# Patient Record
Sex: Female | Born: 1945 | Race: Black or African American | Hispanic: No | Marital: Married | Smoking: Never smoker
Health system: Southern US, Community
[De-identification: ages and names within clinical notes are randomized; demographics above are authoritative.]

## PROBLEM LIST (undated history)

## (undated) DIAGNOSIS — I10 Essential (primary) hypertension: Secondary | ICD-10-CM

## (undated) DIAGNOSIS — D860 Sarcoidosis of lung: Secondary | ICD-10-CM

## (undated) HISTORY — PX: BREAST SURGERY: SHX581

## (undated) HISTORY — PX: FOOT SURGERY: SHX648

---

## 1999-11-04 ENCOUNTER — Other Ambulatory Visit: Admission: RE | Admit: 1999-11-04 | Discharge: 1999-11-04 | Payer: Self-pay | Admitting: Internal Medicine

## 2000-02-02 ENCOUNTER — Encounter: Payer: Self-pay | Admitting: Internal Medicine

## 2000-02-02 ENCOUNTER — Encounter: Admission: RE | Admit: 2000-02-02 | Discharge: 2000-02-02 | Payer: Self-pay | Admitting: Internal Medicine

## 2000-11-21 ENCOUNTER — Other Ambulatory Visit: Admission: RE | Admit: 2000-11-21 | Discharge: 2000-11-21 | Payer: Self-pay | Admitting: Internal Medicine

## 2001-02-05 ENCOUNTER — Encounter: Admission: RE | Admit: 2001-02-05 | Discharge: 2001-02-05 | Payer: Self-pay | Admitting: Internal Medicine

## 2001-02-05 ENCOUNTER — Encounter: Payer: Self-pay | Admitting: Internal Medicine

## 2001-11-11 ENCOUNTER — Ambulatory Visit (HOSPITAL_COMMUNITY): Admission: RE | Admit: 2001-11-11 | Discharge: 2001-11-11 | Payer: Self-pay | Admitting: Gastroenterology

## 2002-02-11 ENCOUNTER — Encounter: Payer: Self-pay | Admitting: Internal Medicine

## 2002-02-11 ENCOUNTER — Encounter: Admission: RE | Admit: 2002-02-11 | Discharge: 2002-02-11 | Payer: Self-pay | Admitting: Internal Medicine

## 2002-04-14 ENCOUNTER — Encounter: Payer: Self-pay | Admitting: *Deleted

## 2002-04-14 ENCOUNTER — Encounter: Admission: RE | Admit: 2002-04-14 | Discharge: 2002-04-14 | Payer: Self-pay | Admitting: *Deleted

## 2003-02-27 ENCOUNTER — Encounter: Payer: Self-pay | Admitting: Internal Medicine

## 2003-02-27 ENCOUNTER — Encounter: Admission: RE | Admit: 2003-02-27 | Discharge: 2003-02-27 | Payer: Self-pay | Admitting: Internal Medicine

## 2003-04-29 ENCOUNTER — Encounter: Admission: RE | Admit: 2003-04-29 | Discharge: 2003-04-29 | Payer: Self-pay | Admitting: Internal Medicine

## 2004-03-03 ENCOUNTER — Encounter: Admission: RE | Admit: 2004-03-03 | Discharge: 2004-03-03 | Payer: Self-pay | Admitting: Internal Medicine

## 2004-03-03 ENCOUNTER — Other Ambulatory Visit: Admission: RE | Admit: 2004-03-03 | Discharge: 2004-03-03 | Payer: Self-pay | Admitting: Internal Medicine

## 2004-09-02 ENCOUNTER — Encounter: Admission: RE | Admit: 2004-09-02 | Discharge: 2004-09-02 | Payer: Self-pay | Admitting: Internal Medicine

## 2005-05-12 ENCOUNTER — Encounter: Admission: RE | Admit: 2005-05-12 | Discharge: 2005-05-12 | Payer: Self-pay | Admitting: Internal Medicine

## 2006-03-02 ENCOUNTER — Encounter: Admission: RE | Admit: 2006-03-02 | Discharge: 2006-03-02 | Payer: Self-pay | Admitting: Internal Medicine

## 2006-05-14 ENCOUNTER — Encounter: Admission: RE | Admit: 2006-05-14 | Discharge: 2006-05-14 | Payer: Self-pay | Admitting: Internal Medicine

## 2007-03-08 ENCOUNTER — Encounter: Admission: RE | Admit: 2007-03-08 | Discharge: 2007-03-08 | Payer: Self-pay | Admitting: Internal Medicine

## 2007-03-08 ENCOUNTER — Other Ambulatory Visit: Admission: RE | Admit: 2007-03-08 | Discharge: 2007-03-08 | Payer: Self-pay | Admitting: Internal Medicine

## 2007-05-21 ENCOUNTER — Encounter: Admission: RE | Admit: 2007-05-21 | Discharge: 2007-05-21 | Payer: Self-pay | Admitting: Internal Medicine

## 2008-03-20 ENCOUNTER — Encounter: Admission: RE | Admit: 2008-03-20 | Discharge: 2008-03-20 | Payer: Self-pay | Admitting: Internal Medicine

## 2008-05-22 ENCOUNTER — Encounter: Admission: RE | Admit: 2008-05-22 | Discharge: 2008-05-22 | Payer: Self-pay | Admitting: Internal Medicine

## 2008-11-11 ENCOUNTER — Ambulatory Visit: Payer: Self-pay | Admitting: Surgery

## 2008-11-11 ENCOUNTER — Encounter (INDEPENDENT_AMBULATORY_CARE_PROVIDER_SITE_OTHER): Payer: Self-pay | Admitting: Emergency Medicine

## 2008-11-11 ENCOUNTER — Emergency Department (HOSPITAL_COMMUNITY): Admission: EM | Admit: 2008-11-11 | Discharge: 2008-11-11 | Payer: Self-pay | Admitting: Emergency Medicine

## 2009-04-14 ENCOUNTER — Encounter: Admission: RE | Admit: 2009-04-14 | Discharge: 2009-04-14 | Payer: Self-pay | Admitting: Internal Medicine

## 2009-05-24 ENCOUNTER — Encounter: Admission: RE | Admit: 2009-05-24 | Discharge: 2009-05-24 | Payer: Self-pay | Admitting: Internal Medicine

## 2010-03-11 ENCOUNTER — Observation Stay (HOSPITAL_COMMUNITY): Admission: EM | Admit: 2010-03-11 | Discharge: 2010-03-11 | Payer: Self-pay | Admitting: Emergency Medicine

## 2010-03-15 ENCOUNTER — Other Ambulatory Visit: Admission: RE | Admit: 2010-03-15 | Discharge: 2010-03-15 | Payer: Self-pay | Admitting: Internal Medicine

## 2010-04-25 ENCOUNTER — Encounter
Admission: RE | Admit: 2010-04-25 | Discharge: 2010-04-25 | Payer: Self-pay | Source: Home / Self Care | Attending: Internal Medicine | Admitting: Internal Medicine

## 2010-05-30 ENCOUNTER — Encounter
Admission: RE | Admit: 2010-05-30 | Discharge: 2010-05-30 | Payer: Self-pay | Source: Home / Self Care | Attending: Internal Medicine | Admitting: Internal Medicine

## 2010-07-27 LAB — CBC
HCT: 43.4 % (ref 36.0–46.0)
MCV: 86.3 fL (ref 78.0–100.0)
RBC: 5.03 MIL/uL (ref 3.87–5.11)
WBC: 4.1 10*3/uL (ref 4.0–10.5)

## 2010-07-27 LAB — DIFFERENTIAL
Eosinophils Relative: 4 % (ref 0–5)
Lymphocytes Relative: 39 % (ref 12–46)
Lymphs Abs: 1.6 10*3/uL (ref 0.7–4.0)
Monocytes Absolute: 0.4 10*3/uL (ref 0.1–1.0)

## 2010-07-27 LAB — BASIC METABOLIC PANEL
Chloride: 107 mEq/L (ref 96–112)
GFR calc Af Amer: >60 mL/min (ref >60)
Potassium: 3.7 mEq/L (ref 3.5–5.1)

## 2010-09-30 NOTE — Op Note (Signed)
[place]  Patient:    Gail Nguyen, Gail Nguyen Visit Number: [id number] MRN: [id number]          Service Type: END Location: ENDO Attending Physician:  Rich Brave Dictated by:   Florencia Reasons, M.D. Proc. Date: 11/11/01 Admit Date:  11/11/2001   CC:         Erskine Speed, M.D.   Operative Report  PROCEDURE:  Colonoscopy.  INDICATIONS:  A 65 year old female for colon cancer screening.  FINDINGS:  Normal examination.  DESCRIPTION OF PROCEDURE:  The nature, purpose, and risks of the procedure had been discussed with the patient who provided written consent.  Sedation was fentanyl 50 mcg and Versed 5 mg IV without arrhythmias or desaturation.  The Olympus adjustable tension pediatric video colonoscope was advanced to the cecum without difficulty and pullback was then performed.  The quality of the prep was excellent and it was felt that all areas were well seen.  The cecum was identified by typical cecal appearance and the absence of further lumen.  This was a normal examination.  No polyps, cancer, colitis, vascular malformations, or diverticulosis were observed.  Retroflexion of the rectum as well as reinspection of the rectusigmoid were unremarkable.  No biopsies were obtained.  The patient tolerated the procedure well and there were no apparent complications.  IMPRESSION:  Normal colonoscopy.  PLAN:  Consider flexible sigmoidoscopy in five years for continued colon cancer screening with perhaps full colonoscopy five years thereafter. Dictated by:   Florencia Reasons, M.D. Attending Physician:  Rich Brave DD:  11/11/01 TD:  11/12/01 Job: [id number]

## 2011-04-18 ENCOUNTER — Ambulatory Visit
Admission: RE | Admit: 2011-04-18 | Discharge: 2011-04-18 | Disposition: A | Discharge status: Home / Self Care | Payer: Managed Care, Other (non HMO) | Source: Ambulatory Visit | Attending: Internal Medicine | Admitting: Internal Medicine

## 2011-04-18 ENCOUNTER — Other Ambulatory Visit: Payer: Self-pay | Admitting: Internal Medicine

## 2011-04-18 DIAGNOSIS — J61 Pneumoconiosis due to asbestos and other mineral fibers: Secondary | ICD-10-CM

## 2011-04-18 DIAGNOSIS — D869 Sarcoidosis, unspecified: Secondary | ICD-10-CM

## 2011-05-04 ENCOUNTER — Other Ambulatory Visit: Payer: Self-pay | Admitting: Internal Medicine

## 2011-05-04 DIAGNOSIS — Z1231 Encounter for screening mammogram for malignant neoplasm of breast: Secondary | ICD-10-CM

## 2011-06-05 ENCOUNTER — Ambulatory Visit
Admission: RE | Admit: 2011-06-05 | Discharge: 2011-06-05 | Disposition: A | Discharge status: Home / Self Care | Payer: Managed Care, Other (non HMO) | Source: Ambulatory Visit | Attending: Internal Medicine | Admitting: Internal Medicine

## 2011-06-05 DIAGNOSIS — Z1231 Encounter for screening mammogram for malignant neoplasm of breast: Secondary | ICD-10-CM

## 2011-12-20 ENCOUNTER — Other Ambulatory Visit: Payer: Self-pay | Admitting: Gastroenterology

## 2012-04-26 ENCOUNTER — Other Ambulatory Visit: Payer: Self-pay | Admitting: Internal Medicine

## 2012-04-26 ENCOUNTER — Ambulatory Visit
Admission: RE | Admit: 2012-04-26 | Discharge: 2012-04-26 | Disposition: A | Discharge status: Home / Self Care | Payer: Managed Care, Other (non HMO) | Source: Ambulatory Visit | Attending: Internal Medicine | Admitting: Internal Medicine

## 2012-04-26 DIAGNOSIS — D869 Sarcoidosis, unspecified: Secondary | ICD-10-CM

## 2012-04-26 DIAGNOSIS — J61 Pneumoconiosis due to asbestos and other mineral fibers: Secondary | ICD-10-CM

## 2012-05-22 ENCOUNTER — Other Ambulatory Visit: Payer: Self-pay | Admitting: Internal Medicine

## 2012-05-22 DIAGNOSIS — Z1231 Encounter for screening mammogram for malignant neoplasm of breast: Secondary | ICD-10-CM

## 2012-06-10 ENCOUNTER — Ambulatory Visit
Admission: RE | Admit: 2012-06-10 | Discharge: 2012-06-10 | Disposition: A | Discharge status: Home / Self Care | Payer: Managed Care, Other (non HMO) | Source: Ambulatory Visit | Attending: Internal Medicine | Admitting: Internal Medicine

## 2012-06-10 DIAGNOSIS — Z1231 Encounter for screening mammogram for malignant neoplasm of breast: Secondary | ICD-10-CM

## 2013-03-07 ENCOUNTER — Ambulatory Visit (INDEPENDENT_AMBULATORY_CARE_PROVIDER_SITE_OTHER): Payer: Managed Care, Other (non HMO)

## 2013-03-07 VITALS — Ht 63.0 in | Wt 192.0 lb

## 2013-03-07 DIAGNOSIS — M79609 Pain in unspecified limb: Secondary | ICD-10-CM

## 2013-03-07 DIAGNOSIS — M722 Plantar fascial fibromatosis: Secondary | ICD-10-CM

## 2013-03-07 DIAGNOSIS — R609 Edema, unspecified: Secondary | ICD-10-CM

## 2013-03-07 NOTE — Patient Instructions (Signed)
Edema Edema is an abnormal build-up of fluids in tissues. Because this is partly dependent on gravity (water flows to the lowest place), it is more common in the legs and thighs (lower extremities). It is also common in the looser tissues, like around the eyes. Painless swelling of the feet and ankles is common and increases as a person ages. It may affect both legs and may include the calves or even thighs. When squeezed, the fluid may move out of the affected area and may leave a dent for a few moments. CAUSES   Prolonged standing or sitting in one place for extended periods of time. Movement helps pump tissue fluid into the veins, and absence of movement prevents this, resulting in edema.  Varicose veins. The valves in the veins do not work as well as they should. This causes fluid to leak into the tissues.  Fluid and salt overload.  Injury, burn, or surgery to the leg, ankle, or foot, may damage veins and allow fluid to leak out.  Sunburn damages vessels. Leaky vessels allow fluid to go out into the sunburned tissues.  Allergies (from insect bites or stings, medications or chemicals) cause swelling by allowing vessels to become leaky.  Protein in the blood helps keep fluid in your vessels. Low protein, as in malnutrition, allows fluid to leak out.  Hormonal changes, including pregnancy and menstruation, cause fluid retention. This fluid may leak out of vessels and cause edema.  Medications that cause fluid retention. Examples are sex hormones, blood pressure medications, steroid treatment, or anti-depressants.  Some illnesses cause edema, especially heart failure, kidney disease, or liver disease.  Surgery that cuts veins or lymph nodes, such as surgery done for the heart or for breast cancer, may result in edema. DIAGNOSIS  Your caregiver is usually easily able to determine what is causing your swelling (edema) by simply asking what is wrong (getting a history) and examining you (doing  a physical). Sometimes x-rays, EKG (electrocardiogram or heart tracing), and blood work may be done to evaluate for underlying medical illness. TREATMENT  General treatment includes:  Leg elevation (or elevation of the affected body part).  Restriction of fluid intake.  Prevention of fluid overload.  Compression of the affected body part. Compression with elastic bandages or support stockings squeezes the tissues, preventing fluid from entering and forcing it back into the blood vessels.  Diuretics (also called water pills or fluid pills) pull fluid out of your body in the form of increased urination. These are effective in reducing the swelling, but can have side effects and must be used only under your caregiver's supervision. Diuretics are appropriate only for some types of edema. The specific treatment can be directed at any underlying causes discovered. Heart, liver, or kidney disease should be treated appropriately. HOME CARE INSTRUCTIONS   Elevate the legs (or affected body part) above the level of the heart, while lying down.  Avoid sitting or standing still for prolonged periods of time.  Avoid putting anything directly under the knees when lying down, and do not wear constricting clothing or garters on the upper legs.  Exercising the legs causes the fluid to work back into the veins and lymphatic channels. This may help the swelling go down.  The pressure applied by elastic bandages or support stockings can help reduce ankle swelling.  A low-salt diet may help reduce fluid retention and decrease the ankle swelling.  Take any medications exactly as prescribed. SEEK MEDICAL CARE IF:  Your edema is   not responding to recommended treatments. SEEK IMMEDIATE MEDICAL CARE IF:   You develop shortness of breath or chest pain.  You cannot breathe when you lay down; or if, while lying down, you have to get up and go to the window to get your breath.  You are having increasing  swelling without relief from treatment.  You develop a fever over 102 F (38.9 C).  You develop pain or redness in the areas that are swollen.  Tell your caregiver right away if you have gained 3 lb/1.4 kg in 1 day or 5 lb/2.3 kg in a week. MAKE SURE YOU:   Understand these instructions.  Will watch your condition.  Will get help right away if you are not doing well or get worse. Document Released: 05/01/2005 Document Revised: 10/31/2011 Document Reviewed: 12/18/2007 [facility] Patient Information 2014 [place], [place].      Plantar Fasciitis Plantar fasciitis is a common condition that causes foot pain. It is soreness (inflammation) of the band of tough fibrous tissue on the bottom of the foot that runs from the heel bone (calcaneus) to the ball of the foot. The cause of this soreness may be from excessive standing, poor fitting shoes, running on hard surfaces, being overweight, having an abnormal walk, or overuse (this is common in runners) of the painful foot or feet. It is also common in aerobic exercise dancers and ballet dancers. SYMPTOMS  Most people with plantar fasciitis complain of:  Severe pain in the morning on the bottom of their foot especially when taking the first steps out of bed. This pain recedes after a few minutes of walking.  Severe pain is experienced also during walking following a long period of inactivity.  Pain is worse when walking barefoot or up stairs DIAGNOSIS   Your caregiver will diagnose this condition by examining and feeling your foot.  Special tests such as X-rays of your foot, are usually not needed. PREVENTION   Consult a sports medicine professional before beginning a new exercise program.  Walking programs offer a good workout. With walking there is a lower chance of overuse injuries common to runners. There is less impact and less jarring of the joints.  Begin all new exercise programs slowly. If problems or pain develop, decrease the  amount of time or distance until you are at a comfortable level.  Wear good shoes and replace them regularly.  Stretch your foot and the heel cords at the back of the ankle (Achilles tendon) both before and after exercise.  Run or exercise on even surfaces that are not hard. For example, asphalt is better than pavement.  Do not run barefoot on hard surfaces.  If using a treadmill, vary the incline.  Do not continue to workout if you have foot or joint problems. Seek professional help if they do not improve. HOME CARE INSTRUCTIONS   Avoid activities that cause you pain until you recover.  Use ice or cold packs on the problem or painful areas after working out.  Only take over-the-counter or prescription medicines for pain, discomfort, or fever as directed by your caregiver.  Soft shoe inserts or athletic shoes with air or gel sole cushions may be helpful.  If problems continue or become more severe, consult a sports medicine caregiver or your own health care provider. Cortisone is a potent anti-inflammatory medication that may be injected into the painful area. You can discuss this treatment with your caregiver. MAKE SURE YOU:   Understand these instructions.  Will watch  your condition.  Will get help right away if you are not doing well or get worse. Document Released: 01/24/2001 Document Revised: 07/24/2011 Document Reviewed: 03/25/2008 [facility] Patient Information 2014 [place], [place].

## 2013-03-07 NOTE — Progress Notes (Signed)
  Subjective:    Patient ID: Gail Nguyen, female    DOB: [birth year], 67 y.o.   MRN: [id number]  HPI Comments: '' I NEED NEW PAIR  ORTHOTICS, BECAUSE MY ANKLES START BOTHERING FOR 3-4 MONTHS''  patient has been wearing orthotics for several years now is having re\re exacerbation of cyma for heart symptoms and orthotics are starting to get worn. Patient also noticing some swelling around her ankles left slightly more than right especially with long-standing work activities on the job.    Review of Systems  Constitutional: Negative.   HENT: Negative.   Eyes: Positive for visual disturbance.  Respiratory: Negative.   Cardiovascular: Negative.   Gastrointestinal: Negative.   Endocrine: Negative.   Genitourinary: Negative.   Musculoskeletal: Negative.   Skin: Negative.   Allergic/Immunologic: Positive for environmental allergies.  Neurological: Negative.   Hematological: Negative.   Psychiatric/Behavioral: Negative.        Objective:   Physical Exam  Constitutional: She is oriented to person, place, and time. She appears well-developed and well-nourished.  Cardiovascular:  Pulses:      Dorsalis pedis pulses are 2+ on the right side, and 2+ on the left side.       Posterior tibial pulses are 2+ on the right side, and 2+ on the left side.  Capillary refill time 3 seconds all digits. Skin temperature warm. Mild edema both ankles left slightly worse than right. No rubor. Mild varicosities noted  Musculoskeletal:  Rectus foot type noted bilateral no significant abnormalities. History of plantar fascial pain being managed with orthoses at the present time. Mild flexible digital contractures 2 through 5 bilateral  Neurological: She is alert and oriented to person, place, and time. She has normal strength and normal reflexes.  Epicritic and proprioceptive sensations intact and symmetric bilateral. Normal plantar response. DTRs unremarkable the  Skin: Skin is warm. No cyanosis. Nails show no  clubbing.  Skin color pigment normal hair growth diminished absent distal nails normal trophic  Psychiatric: She has a normal mood and affect. Her behavior is normal.          Assessment & Plan:  Assessment findings consistent with mild venous insufficiency and edema did recommend the use of compression stockings prescription for [facility] is given for support hose or TED hose. Patient also candidate for you orthoses of the orthotics are worn orthotic scan is carried out at this time for new functional orthoses full length Spenco top cover and extrinsic rear foot post 2. Patient be contacted for fitting when orthotics are ready to be dispensed with the next 4-6 weeks. Followup as appropriate thereafter. Maintain appropriate shoes at all times maintain compression stockings as instructed.  Alvan Dame DPM

## 2013-03-21 ENCOUNTER — Other Ambulatory Visit (HOSPITAL_COMMUNITY)
Admission: RE | Admit: 2013-03-21 | Discharge: 2013-03-21 | Disposition: A | Discharge status: Home / Self Care | Payer: Managed Care, Other (non HMO) | Source: Ambulatory Visit | Attending: Internal Medicine | Admitting: Internal Medicine

## 2013-03-21 DIAGNOSIS — Z01419 Encounter for gynecological examination (general) (routine) without abnormal findings: Secondary | ICD-10-CM | POA: Insufficient documentation

## 2013-04-18 ENCOUNTER — Ambulatory Visit (INDEPENDENT_AMBULATORY_CARE_PROVIDER_SITE_OTHER): Payer: Managed Care, Other (non HMO)

## 2013-04-18 VITALS — BP 153/76 | HR 71 | Resp 18

## 2013-04-18 DIAGNOSIS — M79609 Pain in unspecified limb: Secondary | ICD-10-CM

## 2013-04-18 DIAGNOSIS — M722 Plantar fascial fibromatosis: Secondary | ICD-10-CM

## 2013-04-18 NOTE — Progress Notes (Signed)
   Subjective:    Patient ID: Gail Nguyen, female    DOB: November 25, 1945, 67 y.o.   MRN: [id number]  HPI I am here to get my inserts and my feet are doing better    Review of Systems no changes or new finding     Objective:   Physical Exam Neurovascular status is intact pedal pulses palpable. Epicritic and proprioceptive sensations intact. The orthotics fit and contour well to the foot with full contact. Oral and written instructions on orthotic use and break in are given patient is been in orthotics previously is familiar with the use. Plantar fasciitis is been stable no new findings or new problems noted at this time.       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

## 2013-04-18 NOTE — Patient Instructions (Signed)

## 2013-04-25 ENCOUNTER — Ambulatory Visit
Admission: RE | Admit: 2013-04-25 | Discharge: 2013-04-25 | Disposition: A | Discharge status: Home / Self Care | Payer: Managed Care, Other (non HMO) | Source: Ambulatory Visit | Attending: Internal Medicine | Admitting: Internal Medicine

## 2013-04-25 ENCOUNTER — Other Ambulatory Visit: Payer: Self-pay | Admitting: Internal Medicine

## 2013-04-25 DIAGNOSIS — D869 Sarcoidosis, unspecified: Secondary | ICD-10-CM

## 2013-05-12 ENCOUNTER — Other Ambulatory Visit: Payer: Self-pay | Admitting: Internal Medicine

## 2013-05-12 ENCOUNTER — Ambulatory Visit
Admission: RE | Admit: 2013-05-12 | Discharge: 2013-05-12 | Disposition: A | Discharge status: Home / Self Care | Payer: Managed Care, Other (non HMO) | Source: Ambulatory Visit | Attending: Internal Medicine | Admitting: Internal Medicine

## 2013-05-12 DIAGNOSIS — D869 Sarcoidosis, unspecified: Secondary | ICD-10-CM

## 2013-05-14 ENCOUNTER — Other Ambulatory Visit: Payer: Self-pay

## 2013-05-14 DIAGNOSIS — Z1231 Encounter for screening mammogram for malignant neoplasm of breast: Secondary | ICD-10-CM

## 2013-06-16 ENCOUNTER — Ambulatory Visit: Payer: Managed Care, Other (non HMO)

## 2013-07-07 ENCOUNTER — Ambulatory Visit: Payer: Managed Care, Other (non HMO)

## 2013-07-11 ENCOUNTER — Ambulatory Visit
Admission: RE | Admit: 2013-07-11 | Discharge: 2013-07-11 | Disposition: A | Discharge status: Home / Self Care | Payer: Self-pay | Source: Ambulatory Visit

## 2013-07-11 DIAGNOSIS — Z1231 Encounter for screening mammogram for malignant neoplasm of breast: Secondary | ICD-10-CM

## 2014-04-20 ENCOUNTER — Other Ambulatory Visit: Payer: Self-pay | Admitting: Internal Medicine

## 2014-04-20 ENCOUNTER — Ambulatory Visit
Admission: RE | Admit: 2014-04-20 | Discharge: 2014-04-20 | Disposition: A | Discharge status: Home / Self Care | Payer: Commercial Managed Care - HMO | Source: Ambulatory Visit | Attending: Internal Medicine | Admitting: Internal Medicine

## 2014-04-20 DIAGNOSIS — Z0000 Encounter for general adult medical examination without abnormal findings: Secondary | ICD-10-CM

## 2014-06-10 ENCOUNTER — Other Ambulatory Visit: Payer: Self-pay

## 2014-06-10 DIAGNOSIS — Z1231 Encounter for screening mammogram for malignant neoplasm of breast: Secondary | ICD-10-CM

## 2014-07-13 ENCOUNTER — Encounter (INDEPENDENT_AMBULATORY_CARE_PROVIDER_SITE_OTHER): Payer: Self-pay

## 2014-07-13 ENCOUNTER — Ambulatory Visit
Admission: RE | Admit: 2014-07-13 | Discharge: 2014-07-13 | Disposition: A | Discharge status: Home / Self Care | Payer: Commercial Managed Care - HMO | Source: Ambulatory Visit

## 2014-07-13 DIAGNOSIS — Z1231 Encounter for screening mammogram for malignant neoplasm of breast: Secondary | ICD-10-CM

## 2015-04-29 ENCOUNTER — Ambulatory Visit
Admission: RE | Admit: 2015-04-29 | Discharge: 2015-04-29 | Disposition: A | Discharge status: Home / Self Care | Payer: Commercial Managed Care - HMO | Source: Ambulatory Visit | Attending: Internal Medicine | Admitting: Internal Medicine

## 2015-04-29 ENCOUNTER — Other Ambulatory Visit: Payer: Self-pay | Admitting: Internal Medicine

## 2015-04-29 DIAGNOSIS — J61 Pneumoconiosis due to asbestos and other mineral fibers: Secondary | ICD-10-CM

## 2015-06-07 ENCOUNTER — Other Ambulatory Visit: Payer: Self-pay

## 2015-06-07 DIAGNOSIS — Z1231 Encounter for screening mammogram for malignant neoplasm of breast: Secondary | ICD-10-CM

## 2015-07-14 ENCOUNTER — Ambulatory Visit
Admission: RE | Admit: 2015-07-14 | Discharge: 2015-07-14 | Disposition: A | Discharge status: Home / Self Care | Payer: Medicare Other | Source: Ambulatory Visit

## 2015-07-14 DIAGNOSIS — Z1231 Encounter for screening mammogram for malignant neoplasm of breast: Secondary | ICD-10-CM | POA: Diagnosis not present

## 2015-07-15 DIAGNOSIS — I10 Essential (primary) hypertension: Secondary | ICD-10-CM | POA: Diagnosis not present

## 2015-07-15 DIAGNOSIS — M25562 Pain in left knee: Secondary | ICD-10-CM | POA: Diagnosis not present

## 2015-07-15 DIAGNOSIS — D869 Sarcoidosis, unspecified: Secondary | ICD-10-CM | POA: Diagnosis not present

## 2015-07-23 DIAGNOSIS — D869 Sarcoidosis, unspecified: Secondary | ICD-10-CM | POA: Diagnosis not present

## 2015-07-23 DIAGNOSIS — I10 Essential (primary) hypertension: Secondary | ICD-10-CM | POA: Diagnosis not present

## 2015-10-21 DIAGNOSIS — I119 Hypertensive heart disease without heart failure: Secondary | ICD-10-CM | POA: Diagnosis not present

## 2015-10-21 DIAGNOSIS — D869 Sarcoidosis, unspecified: Secondary | ICD-10-CM | POA: Diagnosis not present

## 2015-11-24 DIAGNOSIS — I10 Essential (primary) hypertension: Secondary | ICD-10-CM | POA: Diagnosis not present

## 2015-11-24 DIAGNOSIS — D869 Sarcoidosis, unspecified: Secondary | ICD-10-CM | POA: Diagnosis not present

## 2016-01-21 DIAGNOSIS — I10 Essential (primary) hypertension: Secondary | ICD-10-CM | POA: Diagnosis not present

## 2016-01-21 DIAGNOSIS — Z23 Encounter for immunization: Secondary | ICD-10-CM | POA: Diagnosis not present

## 2016-01-21 DIAGNOSIS — D869 Sarcoidosis, unspecified: Secondary | ICD-10-CM | POA: Diagnosis not present

## 2016-02-01 DIAGNOSIS — H524 Presbyopia: Secondary | ICD-10-CM | POA: Diagnosis not present

## 2016-03-31 DIAGNOSIS — Z77090 Contact with and (suspected) exposure to asbestos: Secondary | ICD-10-CM | POA: Diagnosis not present

## 2016-03-31 DIAGNOSIS — D869 Sarcoidosis, unspecified: Secondary | ICD-10-CM | POA: Diagnosis not present

## 2016-03-31 DIAGNOSIS — D559 Anemia due to enzyme disorder, unspecified: Secondary | ICD-10-CM | POA: Diagnosis not present

## 2016-03-31 DIAGNOSIS — I10 Essential (primary) hypertension: Secondary | ICD-10-CM | POA: Diagnosis not present

## 2016-03-31 DIAGNOSIS — Z0000 Encounter for general adult medical examination without abnormal findings: Secondary | ICD-10-CM | POA: Diagnosis not present

## 2016-05-01 ENCOUNTER — Ambulatory Visit
Admission: RE | Admit: 2016-05-01 | Discharge: 2016-05-01 | Disposition: A | Discharge status: Home / Self Care | Payer: Medicare Other | Source: Ambulatory Visit | Attending: Internal Medicine | Admitting: Internal Medicine

## 2016-05-01 ENCOUNTER — Other Ambulatory Visit: Payer: Self-pay | Admitting: Internal Medicine

## 2016-05-01 DIAGNOSIS — R918 Other nonspecific abnormal finding of lung field: Secondary | ICD-10-CM | POA: Diagnosis not present

## 2016-05-01 DIAGNOSIS — D869 Sarcoidosis, unspecified: Secondary | ICD-10-CM

## 2016-05-16 DIAGNOSIS — J209 Acute bronchitis, unspecified: Secondary | ICD-10-CM | POA: Diagnosis not present

## 2016-05-16 DIAGNOSIS — D869 Sarcoidosis, unspecified: Secondary | ICD-10-CM | POA: Diagnosis not present

## 2016-06-12 ENCOUNTER — Other Ambulatory Visit: Payer: Self-pay | Admitting: Internal Medicine

## 2016-06-12 DIAGNOSIS — Z1231 Encounter for screening mammogram for malignant neoplasm of breast: Secondary | ICD-10-CM

## 2016-07-17 ENCOUNTER — Ambulatory Visit
Admission: RE | Admit: 2016-07-17 | Discharge: 2016-07-17 | Disposition: A | Discharge status: Home / Self Care | Payer: Medicare Other | Source: Ambulatory Visit | Attending: Internal Medicine | Admitting: Internal Medicine

## 2016-07-17 DIAGNOSIS — Z1231 Encounter for screening mammogram for malignant neoplasm of breast: Secondary | ICD-10-CM | POA: Diagnosis not present

## 2016-09-28 DIAGNOSIS — I10 Essential (primary) hypertension: Secondary | ICD-10-CM | POA: Diagnosis not present

## 2016-09-28 DIAGNOSIS — D869 Sarcoidosis, unspecified: Secondary | ICD-10-CM | POA: Diagnosis not present

## 2016-09-28 DIAGNOSIS — K635 Polyp of colon: Secondary | ICD-10-CM | POA: Diagnosis not present

## 2017-05-02 ENCOUNTER — Ambulatory Visit
Admission: RE | Admit: 2017-05-02 | Discharge: 2017-05-02 | Disposition: A | Discharge status: Home / Self Care | Payer: Medicare Other | Source: Ambulatory Visit | Attending: Internal Medicine | Admitting: Internal Medicine

## 2017-05-02 ENCOUNTER — Other Ambulatory Visit: Payer: Self-pay | Admitting: Internal Medicine

## 2017-05-02 DIAGNOSIS — D869 Sarcoidosis, unspecified: Secondary | ICD-10-CM

## 2017-05-02 DIAGNOSIS — Z8709 Personal history of other diseases of the respiratory system: Secondary | ICD-10-CM

## 2017-06-07 ENCOUNTER — Other Ambulatory Visit: Payer: Self-pay | Admitting: Internal Medicine

## 2017-06-07 DIAGNOSIS — Z1231 Encounter for screening mammogram for malignant neoplasm of breast: Secondary | ICD-10-CM

## 2017-07-18 ENCOUNTER — Ambulatory Visit: Payer: Medicare Other

## 2017-07-26 ENCOUNTER — Ambulatory Visit
Admission: RE | Admit: 2017-07-26 | Discharge: 2017-07-26 | Disposition: A | Discharge status: Home / Self Care | Payer: Medicare Other | Source: Ambulatory Visit | Attending: Internal Medicine | Admitting: Internal Medicine

## 2017-07-26 DIAGNOSIS — Z1231 Encounter for screening mammogram for malignant neoplasm of breast: Secondary | ICD-10-CM

## 2017-07-31 ENCOUNTER — Other Ambulatory Visit: Payer: Self-pay | Admitting: Internal Medicine

## 2017-07-31 ENCOUNTER — Ambulatory Visit
Admission: RE | Admit: 2017-07-31 | Discharge: 2017-07-31 | Disposition: A | Discharge status: Home / Self Care | Payer: Medicare Other | Source: Ambulatory Visit | Attending: Internal Medicine | Admitting: Internal Medicine

## 2017-07-31 DIAGNOSIS — D869 Sarcoidosis, unspecified: Secondary | ICD-10-CM

## 2017-10-11 ENCOUNTER — Other Ambulatory Visit: Payer: Self-pay | Admitting: Dermatology

## 2017-12-26 ENCOUNTER — Other Ambulatory Visit (INDEPENDENT_AMBULATORY_CARE_PROVIDER_SITE_OTHER): Payer: Medicare Other

## 2017-12-26 ENCOUNTER — Ambulatory Visit: Payer: Medicare Other | Admitting: Pulmonary Disease

## 2017-12-26 ENCOUNTER — Encounter: Payer: Self-pay | Admitting: Pulmonary Disease

## 2017-12-26 ENCOUNTER — Ambulatory Visit (INDEPENDENT_AMBULATORY_CARE_PROVIDER_SITE_OTHER)
Admission: RE | Admit: 2017-12-26 | Discharge: 2017-12-26 | Disposition: A | Discharge status: Home / Self Care | Payer: Medicare Other | Source: Ambulatory Visit | Attending: Pulmonary Disease | Admitting: Pulmonary Disease

## 2017-12-26 DIAGNOSIS — R600 Localized edema: Secondary | ICD-10-CM | POA: Diagnosis not present

## 2017-12-26 DIAGNOSIS — D869 Sarcoidosis, unspecified: Secondary | ICD-10-CM | POA: Diagnosis not present

## 2017-12-26 LAB — CBC WITH DIFFERENTIAL/PLATELET
BASOS ABS: 0.1 10*3/uL (ref 0.0–0.1)
Basophils Relative: 0.8 % (ref 0.0–3.0)
Eosinophils Absolute: 0.1 10*3/uL (ref 0.0–0.7)
Eosinophils Relative: 0.9 % (ref 0.0–5.0)
HCT: 43 % (ref 36.0–46.0)
Hemoglobin: 14.2 g/dL (ref 12.0–15.0)
LYMPHS ABS: 1 10*3/uL (ref 0.7–4.0)
Lymphocytes Relative: 11.3 % — ABNORMAL LOW (ref 12.0–46.0)
MCHC: 33.1 g/dL (ref 30.0–36.0)
MCV: 86.2 fl (ref 78.0–100.0)
Monocytes Absolute: 0.7 10*3/uL (ref 0.1–1.0)
Monocytes Relative: 8.3 % (ref 3.0–12.0)
NEUTROS ABS: 7 10*3/uL (ref 1.4–7.7)
NEUTROS PCT: 78.7 % — AB (ref 43.0–77.0)
Platelets: 180 10*3/uL (ref 150.0–400.0)
RBC: 4.99 Mil/uL (ref 3.87–5.11)
RDW: 15.3 % (ref 11.5–15.5)
WBC: 8.9 10*3/uL (ref 4.0–10.5)

## 2017-12-26 LAB — COMPREHENSIVE METABOLIC PANEL
ALT: 26 U/L (ref 0–35)
AST: 16 U/L (ref 0–37)
Albumin: 3.8 g/dL (ref 3.5–5.2)
Alkaline Phosphatase: 50 U/L (ref 39–117)
BUN: 29 mg/dL — AB (ref 6–23)
CO2: 32 meq/L (ref 19–32)
CREATININE: 1.27 mg/dL — AB (ref 0.40–1.20)
Calcium: 10.1 mg/dL (ref 8.4–10.5)
Chloride: 103 mEq/L (ref 96–112)
GFR: 53.32 mL/min — ABNORMAL LOW (ref >60.00)
Glucose, Bld: 93 mg/dL (ref 70–99)
Potassium: 3.6 mEq/L (ref 3.5–5.1)
Sodium: 141 mEq/L (ref 135–145)
Total Bilirubin: 0.6 mg/dL (ref 0.2–1.2)
Total Protein: 7.1 g/dL (ref 6.0–8.3)

## 2017-12-26 NOTE — Assessment & Plan Note (Signed)
May be related to prednisone or to amlodipine. Lasix 20 mg daily for 3 days. Check C met

## 2017-12-26 NOTE — Patient Instructions (Signed)
Chest x-ray and blood work today. Obtain skin biopsy results from dermatologist.  Schedule PFTs.  Stay on 15 mg of prednisone for now. On September 1 drop to 10 mg of prednisone and stay at this dose until we meet again

## 2017-12-26 NOTE — Progress Notes (Signed)
Subjective:    Patient ID: Gail Nguyen, female    DOB: 11/21/1945, 72 y.o.   MRN: [id number]  HPI  Chief Complaint  Patient presents with  . Pulm Consult    Referred by Dr. Levin Erp for sarcoidosis and SOB. Per patient, the SOB has been getting worse for the past few months. Denies any coughing, wheezing or pain.    72 year old never smoker presents for evaluation of sarcoidosis and establish care. She states that diagnosis was made more than 30 years ago when she had an eye exam and she is to live in [place].  She developed a chest cold in December 2018 and cough and shortness of breath persisted.  She also developed skin nodules and saw a dermatologist and this was biopsied around April 2019 which confirmed diagnosis of sarcoidosis.  She was started on prednisone around 20 mg by her PCP and has been maintained on this dose for the last 2 months, just decreased to 15 mg daily. I have reviewed her serial imaging which shows calcified mediastinal lymph nodes dating back to 2003 on a CT scan.  Interstitial lung disease seems to date back to 2006.  There is one chest x-ray in 2015 which shows worsening right lower lobe reticulonodular infiltrate. Chest x-ray from 04/2016 also shows interstitial prominence and calcified lymph nodes with a faint right upper lobe infiltrate.  She was diagnosed with a pneumonia around this time and treated with antibiotics. Follow-up chest x-ray 07/2017 shows clearing of right upper lobe airspace disease   Her cough is now improved.  She denies wheezing. There is no childhood history of asthma.  Skin nodules have decreased and she denies joint problems.  She has hypertension requiring 3 medications to control   Past surgical history none.  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  Lives with her husband.  She moved down to [place] about 15 years ago  Social History   Socioeconomic History  . Marital  status: Married    Spouse name: Not on file  . Number of children: Not on file  . Years of education: Not on file  . Highest education level: Not on file  Occupational History  . Not on file  Social Needs  . Financial resource strain: Not on file  . Food insecurity:    Worry: Not on file    Inability: Not on file  . Transportation needs:    Medical: Not on file    Non-medical: Not on file  Tobacco Use  . Smoking status: Never Smoker  . Smokeless tobacco: Never Used  Substance and Sexual Activity  . Alcohol use: No  . Drug use: No  . Sexual activity: Not on file  Lifestyle  . Physical activity:    Days per week: Not on file    Minutes per session: Not on file  . Stress: Not on file  Relationships  . Social connections:    Talks on phone: Not on file    Gets together: Not on file    Attends religious service: Not on file    Active member of club or organization: Not on file    Attends meetings of clubs or organizations: Not on file    Relationship status: Not on file  . Intimate partner violence:    Fear of current or ex partner: Not on file    Emotionally abused: Not on file    Physically abused:  Not on file    Forced sexual activity: Not on file  Other Topics Concern  . Not on file  Social History Narrative  . Not on file     Family History  Problem Relation Age of Onset  . Breast cancer Neg Hx        Review of Systems  Constitutional: Negative for fever and unexpected weight change.  HENT: Negative for congestion, dental problem, ear pain, nosebleeds, postnasal drip, rhinorrhea, sinus pressure, sneezing, sore throat and trouble swallowing.   Eyes: Negative for redness and itching.  Respiratory: Positive for cough and shortness of breath. Negative for chest tightness and wheezing.   Cardiovascular: Negative for palpitations and leg swelling.  Gastrointestinal: Negative for nausea and vomiting.  Genitourinary: Negative for dysuria.  Musculoskeletal:  Negative for joint swelling.  Skin: Negative for rash.  Allergic/Immunologic: Negative.  Negative for environmental allergies, food allergies and immunocompromised state.  Neurological: Negative for headaches.  Hematological: Does not bruise/bleed easily.  Psychiatric/Behavioral: Negative for dysphoric mood. The patient is not nervous/anxious.        Objective:   Physical Exam   Gen. Pleasant, well-nourished, in no distress, normal affect ENT - no lesions, no post nasal drip Neck: No JVD, no thyromegaly, no carotid bruits Lungs: no use of accessory muscles, no dullness to percussion, clear without rales or rhonchi  Cardiovascular: Rhythm regular, heart sounds  normal, no murmurs or gallops, 1+ peripheral edema Abdomen: soft and non-tender, no hepatosplenomegaly, BS normal. Musculoskeletal: No deformities, no cyanosis or clubbing, no joint swelling Neuro:  alert, non focal Skin - no nodules        Assessment & Plan:

## 2017-12-26 NOTE — Assessment & Plan Note (Signed)
Appears to be clearly sarcoidosis but lung, skin and eye involvement. Imaging changes date back to 2003  Chest x-ray and blood work today. Obtain skin biopsy results from dermatologist.  Schedule PFTs.  Stay on 15 mg of prednisone for now. On September 1 drop to 10 mg of prednisone and stay at this dose until we meet again

## 2017-12-28 ENCOUNTER — Other Ambulatory Visit: Payer: Self-pay | Admitting: Pulmonary Disease

## 2017-12-28 DIAGNOSIS — R600 Localized edema: Secondary | ICD-10-CM

## 2017-12-28 DIAGNOSIS — D869 Sarcoidosis, unspecified: Secondary | ICD-10-CM

## 2017-12-28 LAB — ANGIOTENSIN CONVERTING ENZYME: ANGIOTENSIN-CONVERTING ENZYME: 13 U/L (ref 9–67)

## 2017-12-28 NOTE — Progress Notes (Signed)
Spoke with pt and notified of results per Dr. Alva. Pt verbalized understanding and denied any questions. 

## 2017-12-31 ENCOUNTER — Other Ambulatory Visit: Payer: Self-pay

## 2017-12-31 ENCOUNTER — Ambulatory Visit (HOSPITAL_COMMUNITY): Payer: Medicare Other | Attending: Cardiology

## 2017-12-31 DIAGNOSIS — D869 Sarcoidosis, unspecified: Secondary | ICD-10-CM | POA: Insufficient documentation

## 2017-12-31 DIAGNOSIS — I071 Rheumatic tricuspid insufficiency: Secondary | ICD-10-CM | POA: Insufficient documentation

## 2017-12-31 DIAGNOSIS — I313 Pericardial effusion (noninflammatory): Secondary | ICD-10-CM | POA: Diagnosis not present

## 2017-12-31 DIAGNOSIS — R600 Localized edema: Secondary | ICD-10-CM | POA: Insufficient documentation

## 2018-01-02 ENCOUNTER — Telehealth: Payer: Self-pay | Admitting: Pulmonary Disease

## 2018-01-02 NOTE — Telephone Encounter (Signed)
Advised pt of results. Pt understood and nothing further is needed.    Notes recorded by Rigoberto Noel, MD on 12/29/2017 at 6:12 AM EDT Normal ACE level

## 2018-01-10 ENCOUNTER — Ambulatory Visit (INDEPENDENT_AMBULATORY_CARE_PROVIDER_SITE_OTHER)
Admission: RE | Admit: 2018-01-10 | Discharge: 2018-01-10 | Disposition: A | Discharge status: Home / Self Care | Payer: Medicare Other | Source: Ambulatory Visit | Attending: Pulmonary Disease | Admitting: Pulmonary Disease

## 2018-01-10 DIAGNOSIS — D869 Sarcoidosis, unspecified: Secondary | ICD-10-CM

## 2018-02-11 ENCOUNTER — Other Ambulatory Visit: Payer: Self-pay

## 2018-02-11 DIAGNOSIS — D869 Sarcoidosis, unspecified: Secondary | ICD-10-CM

## 2018-02-12 ENCOUNTER — Encounter: Payer: Self-pay | Admitting: Pulmonary Disease

## 2018-02-12 ENCOUNTER — Ambulatory Visit: Payer: Medicare Other | Admitting: Pulmonary Disease

## 2018-02-12 ENCOUNTER — Ambulatory Visit (INDEPENDENT_AMBULATORY_CARE_PROVIDER_SITE_OTHER): Payer: Medicare Other | Admitting: Pulmonary Disease

## 2018-02-12 DIAGNOSIS — D869 Sarcoidosis, unspecified: Secondary | ICD-10-CM

## 2018-02-12 DIAGNOSIS — Z23 Encounter for immunization: Secondary | ICD-10-CM | POA: Diagnosis not present

## 2018-02-12 DIAGNOSIS — R600 Localized edema: Secondary | ICD-10-CM | POA: Diagnosis not present

## 2018-02-12 LAB — PULMONARY FUNCTION TEST
DL/VA % pred: 98 %
DL/VA: 4.45 ml/min/mmHg/L
DLCO COR % PRED: 66 %
DLCO UNC: 14.66 ml/min/mmHg
DLCO cor: 14.32 ml/min/mmHg
DLCO unc % pred: 67 %
FEF 25-75 POST: 1.91 L/s
FEF 25-75 Pre: 2.24 L/sec
FEF2575-%Change-Post: -14 %
FEF2575-%Pred-Post: 127 %
FEF2575-%Pred-Pre: 149 %
FEV1-%CHANGE-POST: -1 %
FEV1-%PRED-POST: 100 %
FEV1-%PRED-PRE: 102 %
FEV1-POST: 1.62 L
FEV1-Pre: 1.65 L
FEV1FVC-%Change-Post: 4 %
FEV1FVC-%PRED-PRE: 108 %
FEV6-%Change-Post: -4 %
FEV6-%Pred-Post: 92 %
FEV6-%Pred-Pre: 96 %
FEV6-Post: 1.86 L
FEV6-Pre: 1.94 L
FEV6FVC-%Change-Post: 1 %
FEV6FVC-%Pred-Post: 105 %
FEV6FVC-%Pred-Pre: 103 %
FVC-%Change-Post: -5 %
FVC-%PRED-POST: 87 %
FVC-%PRED-PRE: 93 %
FVC-POST: 1.86 L
FVC-Pre: 1.97 L
PRE FEV6/FVC RATIO: 99 %
Post FEV1/FVC ratio: 87 %
Post FEV6/FVC ratio: 100 %
Pre FEV1/FVC ratio: 84 %
RV % pred: 80 %
RV: 1.7 L
TLC % PRED: 78 %
TLC: 3.73 L

## 2018-02-12 NOTE — Assessment & Plan Note (Signed)
Could be related to steroids or to Norvasc Lasix 20 mg daily for 3 days

## 2018-02-12 NOTE — Assessment & Plan Note (Signed)
Flu shot today. Decrease prednisone to 5 mg daily starting today. On November 1, dropped to 5 mg on Monday/Wednesday/Friday. On December 1 stop taking prednisone.  Chest x-ray on next visit

## 2018-02-12 NOTE — Patient Instructions (Signed)
Flu shot today. Decrease prednisone to 5 mg daily starting today. On November 1, dropped to 5 mg on Monday/Wednesday/Friday. On December 1 stop taking prednisone.  Chest x-ray on next visit  Lasix 20 mg daily for 3 days #10

## 2018-02-12 NOTE — Progress Notes (Signed)
   Subjective:    Patient ID: Gail Nguyen, female    DOB: April 25, 1946, 72 y.o.   MRN: [id number]  HPI   72 year old never smoker for FU of sarcoidosis . Diagnosis was made more than 30 years ago on an eye exam .  She developed a chest cold in December 2018 and cough and shortness of breath persisted.  She also developed skin nodules and saw a dermatologist and this was biopsied 5/ 2019 which confirmed diagnosis of sarcoidosis.    She was started on 20 mg of prednisone by PCP in 10/2017, I drop this to 15 mg in August and 10 mg in September.  She feels well, cough is resolved. Reports dyspnea on walking upstairs or heavy exertion Skin nodules have not recurred     Last chest x-ray 07/2017 shows clearing of right upper lobe airspace disease We reviewed HRCT and PFTs in detail today.  Complains of pedal edema for the past 3 months  Significant tests/ events reviewed  09/2017 skin bx fore arm >> non caseating granuloma  CT chest 2003 calcified mediastinal lymph nodes   Interstitial lung disease seems to date back to 2006. HRCT 12/2017-calcified mediastinal lymph nodes, bilateral upper lobe changes consistent with sarcoid.  PFTs 02/2018-no evidence of airway obstruction, FVC 93%, TLC 78%, DLCO 67% but corrects with alveolar volume?  Changes of obesity no evidence of clear Review of Systems neg for any significant sore throat, dysphagia, itching, sneezing, nasal congestion or excess/ purulent secretions, fever, chills, sweats, unintended wt loss, pleuritic or exertional cp, hempoptysis, orthopnea pnd or change in chronic leg swelling. Also denies presyncope, palpitations, heartburn, abdominal pain, nausea, vomiting, diarrhea or change in bowel or urinary habits, dysuria,hematuria, rash, arthralgias, visual complaints, headache, numbness weakness or ataxia.     Objective:   Physical Exam  Gen. Pleasant, obese, in no distress ENT - no lesions, no post nasal drip Neck: No JVD, no thyromegaly,  no carotid bruits Lungs: no use of accessory muscles, no dullness to percussion, decreased without rales or rhonchi  Cardiovascular: Rhythm regular, heart sounds  normal, no murmurs or gallops, 1+  peripheral edema Musculoskeletal: No deformities, no cyanosis or clubbing , no tremors       Assessment & Plan:

## 2018-02-12 NOTE — Progress Notes (Signed)
PFT completed today. 02/12/18

## 2018-04-25 ENCOUNTER — Ambulatory Visit: Payer: Medicare Other | Admitting: Pulmonary Disease

## 2018-04-25 ENCOUNTER — Ambulatory Visit (INDEPENDENT_AMBULATORY_CARE_PROVIDER_SITE_OTHER)
Admission: RE | Admit: 2018-04-25 | Discharge: 2018-04-25 | Disposition: A | Discharge status: Home / Self Care | Payer: Medicare Other | Source: Ambulatory Visit | Attending: Pulmonary Disease | Admitting: Pulmonary Disease

## 2018-04-25 ENCOUNTER — Encounter: Payer: Self-pay | Admitting: Pulmonary Disease

## 2018-04-25 DIAGNOSIS — D869 Sarcoidosis, unspecified: Secondary | ICD-10-CM | POA: Diagnosis not present

## 2018-04-25 DIAGNOSIS — R600 Localized edema: Secondary | ICD-10-CM | POA: Diagnosis not present

## 2018-04-25 MED ORDER — FUROSEMIDE 20 MG PO TABS: 20.0000 mg | ORAL_TABLET | Freq: Every day | ORAL | 0 refills | Status: DC

## 2018-04-25 MED ORDER — PREDNISONE 10 MG PO TABS: ORAL_TABLET | ORAL | 0 refills | Status: DC

## 2018-04-25 NOTE — Assessment & Plan Note (Signed)
Lasix 20 mg for 3 days, then every other day # 10

## 2018-04-25 NOTE — Addendum Note (Signed)
Addended by: Valerie Salts on: 04/25/2018 09:28 AM   Modules accepted: Orders

## 2018-04-25 NOTE — Progress Notes (Signed)
   Subjective:    Patient ID: Gail Nguyen, female    DOB: 06/13/1945, 72 y.o.   MRN: [id number]  HPI  72 year old never smoker for FU of sarcoidosis . Diagnosis was made more than 30 years ago on an eye exam . She developed a chest cold in December 2018 and cough and shortness of breath persisted. She also developed skin nodules and saw a dermatologist and this was biopsied 5/ 2019 which confirmed diagnosis of sarcoidosis.   She was started on 20 mg of prednisone by PCP in 10/2017, tapered to off by December 1. She gained some weight due to prednisone. She also developed mild pedal edema. She says that shortness of breath is back she also complains of pain in both her knees which feels like her sarcoidosis symptoms are coming back.  No skin lesions  Significant tests/ events reviewed  09/2017 skin bx fore arm >> non caseating granuloma  CT chest 2003 calcified mediastinal lymph nodes  Interstitial lung disease seems to date back to 2006. HRCT 12/2017-calcified mediastinal lymph nodes, bilateral upper lobe changes consistent with sarcoid.  PFTs 02/2018-no evidence of airway obstruction, FVC 93%, TLC 78%, DLCO 67% but corrects with alveolar volume?  Changes of obesity no evidence of clear   Review of Systems neg for any significant sore throat, dysphagia, itching, sneezing, nasal congestion or excess/ purulent secretions, fever, chills, sweats, unintended wt loss, pleuritic or exertional cp, hempoptysis, orthopnea pnd or change in chronic leg swelling. Also denies presyncope, palpitations, heartburn, abdominal pain, nausea, vomiting, diarrhea or change in bowel or urinary habits, dysuria,hematuria, rash, arthralgias, visual complaints, headache, numbness weakness or ataxia.     Objective:   Physical Exam   Gen. Pleasant, obese, in no distress ENT - no lesions, no post nasal drip Neck: No JVD, no thyromegaly, no carotid bruits Lungs: no use of accessory muscles, no dullness to  percussion, decreased without rales or rhonchi  Cardiovascular: Rhythm regular, heart sounds  normal, no murmurs or gallops, 1+ peripheral edema Musculoskeletal: No deformities, no cyanosis or clubbing , no tremors        Assessment & Plan:

## 2018-04-25 NOTE — Patient Instructions (Signed)
Start back on 10 mg of prednisone for 1 week. On 12/20, decrease to 5 mg of prednisone and stay at this dose If no problems on this dose by February 1, then okay to decrease to 5 mg every other day/Monday/Wednesday/Friday  Chest x-ray today Call us if you have increased symptoms at any dose

## 2018-04-25 NOTE — Addendum Note (Signed)
Addended by: Valerie Salts on: 04/25/2018 09:37 AM   Modules accepted: Orders

## 2018-04-25 NOTE — Assessment & Plan Note (Signed)
Feel that her sarcoidosis symptoms are coming back with pain in her knees and mild dyspnea. Chest x-ray today to confirm  Start back on 10 mg of prednisone for 1 week. On 12/20, decrease to 5 mg of prednisone and stay at this dose If no problems on this dose by February 1, then okay to decrease to 5 mg every other day/Monday/Wednesday/Friday  Goal will be to keep her on large dose of prednisone and keep symptoms and check

## 2018-04-26 ENCOUNTER — Telehealth: Payer: Self-pay | Admitting: Pulmonary Disease

## 2018-04-26 NOTE — Telephone Encounter (Signed)
Call made to patient, made aware of CXR results. Voiced understanding. Nothing further is needed at this time.

## 2018-06-19 ENCOUNTER — Other Ambulatory Visit: Payer: Self-pay | Admitting: Internal Medicine

## 2018-06-19 DIAGNOSIS — Z1231 Encounter for screening mammogram for malignant neoplasm of breast: Secondary | ICD-10-CM

## 2018-07-30 ENCOUNTER — Other Ambulatory Visit: Payer: Self-pay

## 2018-07-30 ENCOUNTER — Ambulatory Visit
Admission: RE | Admit: 2018-07-30 | Discharge: 2018-07-30 | Disposition: A | Discharge status: Home / Self Care | Payer: Medicare Other | Source: Ambulatory Visit | Attending: Internal Medicine | Admitting: Internal Medicine

## 2018-07-30 DIAGNOSIS — Z1231 Encounter for screening mammogram for malignant neoplasm of breast: Secondary | ICD-10-CM

## 2019-04-15 ENCOUNTER — Ambulatory Visit
Admission: RE | Admit: 2019-04-15 | Discharge: 2019-04-15 | Disposition: A | Discharge status: Home / Self Care | Payer: Medicare Other | Source: Ambulatory Visit | Attending: Internal Medicine | Admitting: Internal Medicine

## 2019-04-15 ENCOUNTER — Other Ambulatory Visit: Payer: Self-pay | Admitting: Internal Medicine

## 2019-04-15 DIAGNOSIS — R053 Chronic cough: Secondary | ICD-10-CM

## 2019-04-15 DIAGNOSIS — R05 Cough: Secondary | ICD-10-CM

## 2019-04-15 DIAGNOSIS — J61 Pneumoconiosis due to asbestos and other mineral fibers: Secondary | ICD-10-CM

## 2019-05-30 ENCOUNTER — Other Ambulatory Visit: Payer: Self-pay

## 2019-05-30 ENCOUNTER — Ambulatory Visit: Payer: Medicare Other | Admitting: Pulmonary Disease

## 2019-05-30 ENCOUNTER — Encounter: Payer: Self-pay | Admitting: Pulmonary Disease

## 2019-05-30 DIAGNOSIS — D869 Sarcoidosis, unspecified: Secondary | ICD-10-CM

## 2019-05-30 MED ORDER — PREDNISONE 5 MG PO TABS: ORAL_TABLET | ORAL | 0 refills | Status: DC

## 2019-05-30 NOTE — Addendum Note (Signed)
Addended by: Lia Foyer R on: 05/30/2019 04:16 PM   Modules accepted: Orders

## 2019-05-30 NOTE — Patient Instructions (Signed)
Start prednisone 5 mg tablets-2 tablets daily for 10 days Then 1 tablet daily for 10 days, then stop  Schedule high-resolution CT chest

## 2019-05-30 NOTE — Assessment & Plan Note (Signed)
She does seem to have flareup of sarcoidosis Right lower lobe infiltrate was noted on chest x-ray 04/15/2019 which I reviewed this was treated as pneumonia with antibiotics but I think this may be related to sarcoid flare.  Chest x-ray 05/2017 also shows bilateral interstitial opacities to my review.  She does not seem to have any skin or eye involvement this time We will treat with low-dose prednisone but it is likely that she may have active disease for a while Start prednisone 5 mg tablets-2 tablets daily for 10 days Then 1 tablet daily for 10 days, then stop  We discussed side effects of long-term steroids  Schedule high-resolution CT chest

## 2019-05-30 NOTE — Progress Notes (Signed)
   Subjective:    Patient ID: Gail Nguyen, female    DOB: [birth year], 74 y.o.   MRN: [id number]  HPI  74 yo never smokerfor FUof sarcoidosis . Diagnosis was made more than 30 years agoonan eye exam . She developed skin nodules and saw a dermatologist and this was biopsied5/2019 which confirmed diagnosis of sarcoidosis.   Chief Complaint  Patient presents with  . Follow-up    Patient is here for follow up for Sarcoidosis. Patient states that she is doing better since last visit. Patient has a dry cough. Patient was given nasal spray and claritin which seems to be helping with cough.    Last seen 04/2018 -she was given a low-dose prednisone taper and this seemed to help with her cough and few weeks after she came off prednisone, cough return. She was seen in December for routine physical and chest x-ray suggested right lower lobe infiltrate which was treated with antibiotics by her PCP. Cough is returned now for a few months, no skin lesions or eye symptoms  I reviewed her prior imaging studies and blood work   Significant tests/ events reviewed  09/2017 skin bx fore arm >> non caseating granuloma  CT chest 2003calcified mediastinal lymph nodes Interstitial lung disease seems to date back to 2006. HRCT8/2019-calcified mediastinal lymph nodes, bilateral upper lobe changes consistent with sarcoid.  PFTs 02/2018-no evidence of airway obstruction, FVC 93%, TLC 78%, DLCO 67% but corrects with alveolar volume? Changes of obesity   Review of Systems neg for any significant sore throat, dysphagia, itching, sneezing, nasal congestion or excess/ purulent secretions, fever, chills, sweats, unintended wt loss, pleuritic or exertional cp, hempoptysis, orthopnea pnd or change in chronic leg swelling. Also denies presyncope, palpitations, heartburn, abdominal pain, nausea, vomiting, diarrhea or change in bowel or urinary habits, dysuria,hematuria, rash, arthralgias, visual complaints,  headache, numbness weakness or ataxia.     Objective:   Physical Exam  Gen. Pleasant, obese, in no distress ENT - no lesions, no post nasal drip Neck: No JVD, no thyromegaly, no carotid bruits Lungs: no use of accessory muscles, no dullness to percussion, decreased without rales or rhonchi  Cardiovascular: Rhythm regular, heart sounds  normal, no murmurs or gallops, no peripheral edema Musculoskeletal: No deformities, no cyanosis or clubbing , no tremors Skin-no lesions over back or feet      Assessment & Plan:

## 2019-06-16 ENCOUNTER — Other Ambulatory Visit: Payer: Self-pay

## 2019-06-16 ENCOUNTER — Ambulatory Visit (INDEPENDENT_AMBULATORY_CARE_PROVIDER_SITE_OTHER)
Admission: RE | Admit: 2019-06-16 | Discharge: 2019-06-16 | Disposition: A | Discharge status: Home / Self Care | Payer: Medicare Other | Source: Ambulatory Visit | Attending: Pulmonary Disease | Admitting: Pulmonary Disease

## 2019-06-16 DIAGNOSIS — D869 Sarcoidosis, unspecified: Secondary | ICD-10-CM

## 2019-06-20 ENCOUNTER — Other Ambulatory Visit: Payer: Self-pay | Admitting: Internal Medicine

## 2019-06-20 DIAGNOSIS — Z1231 Encounter for screening mammogram for malignant neoplasm of breast: Secondary | ICD-10-CM

## 2019-07-31 ENCOUNTER — Ambulatory Visit: Payer: Medicare Other

## 2019-08-28 ENCOUNTER — Ambulatory Visit: Payer: Medicare Other | Admitting: Adult Health

## 2019-08-29 ENCOUNTER — Other Ambulatory Visit: Payer: Self-pay

## 2019-08-29 ENCOUNTER — Encounter: Payer: Self-pay | Admitting: Adult Health

## 2019-08-29 ENCOUNTER — Ambulatory Visit: Payer: Medicare Other | Admitting: Adult Health

## 2019-08-29 DIAGNOSIS — D869 Sarcoidosis, unspecified: Secondary | ICD-10-CM | POA: Diagnosis not present

## 2019-08-29 NOTE — Patient Instructions (Addendum)
Continue on current regimen Activity as tolerated Follow-up with Dr. Elsworth Soho in 3-4 months and as needed

## 2019-08-29 NOTE — Assessment & Plan Note (Signed)
Recent flare now improved. CT chest shows stable changes  Activity as tolerated.    Plan Patient Instructions  Continue on current regimen Activity as tolerated Follow-up with Dr. Elsworth Soho in 3-4 months and as needed

## 2019-08-29 NOTE — Progress Notes (Signed)
@Patient  ID: Gail Nguyen, female    DOB: [birth year], 74 y.o.   MRN: [id number]  Chief Complaint  Patient presents with  . Follow-up    Sarcoid     Referring provider: Sueanne Margarita, DO  HPI: 74 year old female never smoker followed for sarcoidosis Sarcoidosis diagnosis made more than 30 years ago on eye exam.  She also had a skin biopsy with skin nodules with confirmation of diagnosis of sarcoidosis  TEST/EVENTS :  09/2017 skin bx fore arm >> non caseating granuloma  CT chest 2003calcified mediastinal lymph nodes Interstitial lung disease seems to date back to 2006. HRCT8/2019-calcified mediastinal lymph nodes, bilateral upper lobe changes consistent with sarcoid.  PFTs 02/2018-no evidence of airway obstruction, FVC 93%, TLC 78%, DLCO 67% but corrects with alveolar volume? Changes of obesity   08/29/2019 Follow up : Sarcoid  Patient returns for a 34-month follow-up.  Patient was treated for a sarcoidosis flareup last visit.  She was treated with prednisone for 3 weeks. High-resolution CT chest was done on June 16, 2019 that showed able pattern of fibrosis since 2019 small pericardial effusion, central traction bronchiectasis with mild associated interstitial coarsening.  Since last visit patient says she is doing some better. Cough got much better while on steroids . Did notice slight return when finished with prednisone . But seems to be leveled off and is tolerable.  Not very active. Does light yard work and house work. Has some knee issues, going to therapy .    No Known Allergies  Immunization History  Administered Date(s) Administered  . Fluad Quad(high Dose 65+) 02/05/2019  . Influenza, High Dose Seasonal PF 02/12/2018    History reviewed. No pertinent past medical history.  Tobacco History: Social History   Tobacco Use  Smoking Status Never Smoker  Smokeless Tobacco Never Used   Counseling given: Not Answered   Outpatient Medications Prior to Visit   Medication Sig Dispense Refill  . amLODipine (NORVASC) 5 MG tablet Take by mouth.    . carvedilol (COREG) 25 MG tablet     . fluticasone (FLONASE) 50 MCG/ACT nasal spray Place 1 spray into both nostrils daily.    . Loratadine (CLARITIN) 10 MG CAPS Take 10 mg by mouth daily.    . valsartan-hydrochlorothiazide (DIOVAN-HCT) 320-25 MG tablet Take by mouth.    . predniSONE (DELTASONE) 5 MG tablet 2 tablets daily for 10 days, 1 tablet daily for 10 days 30 tablet 0   No facility-administered medications prior to visit.     Review of Systems:   Constitutional:   No  weight loss, night sweats,  Fevers, chills, tigue, or  lassitude.  HEENT:   No headaches,  Difficulty swallowing,  Tooth/dental problems, or  Sore throat,                No sneezing, itching, ear ache, nasal congestion, post nasal drip,   CV:  No chest pain,  Orthopnea, PND, swelling in lower extremities, anasarca, dizziness, palpitations, syncope.   GI  No heartburn, indigestion, abdominal pain, nausea, vomiting, diarrhea, change in bowel habits, loss of appetite, bloody stools.   Resp: No chest wall deformity  Skin: no rash or lesions.  GU: no dysuria, change in color of urine, no urgency or frequency.  No flank pain, no hematuria   MS:  Knee pain +     Physical Exam  BP 138/68 (BP Location: Left Arm, Cuff Size: Normal)   Pulse 64   Temp 97.7 F (36.5 C) (Temporal)  Ht 5' 3.5" (1.613 m)   Wt 208 lb (94.3 kg)   SpO2 98% Comment: RA  BMI 36.27 kg/m   GEN: A/Ox3; pleasant , NAD, well nourished    HEENT:  [place]/AT,   NOSE-clear, THROAT-clear, no lesions, no postnasal drip or exudate noted.   NECK:  Supple w/ fair ROM; no JVD; normal carotid impulses w/o bruits; no thyromegaly or nodules palpated; no lymphadenopathy.    RESP  Clear  P & A; w/o, wheezes/ rales/ or rhonchi. no accessory muscle use, no dullness to percussion  CARD:  RRR, no m/r/g, no peripheral edema, pulses intact, no cyanosis or clubbing.  GI:    Soft & nt; nml bowel sounds; no organomegaly or masses detected.   Musco: Warm bil, no deformities or joint swelling noted.   Neuro: alert, no focal deficits noted.    Skin: Warm, no lesions or rashes    Lab Results:  CBC    PFT Results Latest Ref Rng & Units 02/12/2018  FVC-Pre L 1.97  FVC-Predicted Pre % 93  FVC-Post L 1.86  FVC-Predicted Post % 87  Pre FEV1/FVC % % 84  Post FEV1/FCV % % 87  FEV1-Pre L 1.65  FEV1-Predicted Pre % 102  FEV1-Post L 1.62  DLCO UNC% % 67  DLCO COR %Predicted % 98  TLC L 3.73  TLC % Predicted % 78  RV % Predicted % 80    No results found for: NITRICOXIDE      Assessment & Plan:   Sarcoidosis Recent flare now improved. CT chest shows stable changes  Activity as tolerated.    Plan Patient Instructions  Continue on current regimen Activity as tolerated Follow-up with Dr. Elsworth Soho in 3-4 months and as needed        Rexene Edison, NP 08/29/2019

## 2019-09-01 ENCOUNTER — Ambulatory Visit
Admission: RE | Admit: 2019-09-01 | Discharge: 2019-09-01 | Disposition: A | Discharge status: Home / Self Care | Payer: Medicare Other | Source: Ambulatory Visit | Attending: Internal Medicine | Admitting: Internal Medicine

## 2019-09-01 ENCOUNTER — Other Ambulatory Visit: Payer: Self-pay

## 2019-09-01 DIAGNOSIS — Z1231 Encounter for screening mammogram for malignant neoplasm of breast: Secondary | ICD-10-CM

## 2019-09-02 ENCOUNTER — Other Ambulatory Visit: Payer: Self-pay | Admitting: Internal Medicine

## 2019-09-02 DIAGNOSIS — R928 Other abnormal and inconclusive findings on diagnostic imaging of breast: Secondary | ICD-10-CM

## 2019-09-12 ENCOUNTER — Ambulatory Visit
Admission: RE | Admit: 2019-09-12 | Discharge: 2019-09-12 | Disposition: A | Discharge status: Home / Self Care | Payer: Medicare Other | Source: Ambulatory Visit | Attending: Internal Medicine | Admitting: Internal Medicine

## 2019-09-12 ENCOUNTER — Other Ambulatory Visit: Payer: Self-pay | Admitting: Internal Medicine

## 2019-09-12 ENCOUNTER — Other Ambulatory Visit: Payer: Self-pay

## 2019-09-12 DIAGNOSIS — R928 Other abnormal and inconclusive findings on diagnostic imaging of breast: Secondary | ICD-10-CM

## 2020-08-05 ENCOUNTER — Other Ambulatory Visit: Payer: Self-pay | Admitting: Internal Medicine

## 2020-08-05 DIAGNOSIS — Z1231 Encounter for screening mammogram for malignant neoplasm of breast: Secondary | ICD-10-CM

## 2020-09-28 ENCOUNTER — Ambulatory Visit
Admission: RE | Admit: 2020-09-28 | Discharge: 2020-09-28 | Disposition: A | Discharge status: Home / Self Care | Payer: Medicare Other | Source: Ambulatory Visit | Attending: Internal Medicine | Admitting: Internal Medicine

## 2020-09-28 ENCOUNTER — Other Ambulatory Visit: Payer: Self-pay

## 2020-09-28 DIAGNOSIS — Z1231 Encounter for screening mammogram for malignant neoplasm of breast: Secondary | ICD-10-CM

## 2020-10-19 ENCOUNTER — Ambulatory Visit: Payer: Medicare Other | Admitting: Pulmonary Disease

## 2020-10-19 ENCOUNTER — Other Ambulatory Visit: Payer: Self-pay

## 2020-10-19 ENCOUNTER — Encounter: Payer: Self-pay | Admitting: Pulmonary Disease

## 2020-10-19 ENCOUNTER — Ambulatory Visit (INDEPENDENT_AMBULATORY_CARE_PROVIDER_SITE_OTHER): Payer: Medicare Other

## 2020-10-19 VITALS — BP 146/78 | HR 60 | Temp 97.9°F | Ht 63.0 in | Wt 198.0 lb

## 2020-10-19 DIAGNOSIS — I10 Essential (primary) hypertension: Secondary | ICD-10-CM | POA: Diagnosis not present

## 2020-10-19 DIAGNOSIS — D869 Sarcoidosis, unspecified: Secondary | ICD-10-CM

## 2020-10-19 NOTE — Assessment & Plan Note (Signed)
Blood pressure high today.  She states that when she measured at home it was 128/65 so appears to be whitecoat hypertension She is compliant with blood pressure medications

## 2020-10-19 NOTE — Assessment & Plan Note (Signed)
HRCT was stable 1 year ago, we will reassess with chest x-ray.  Her cough is manageable. She would like to avoid continuous medication, prednisone makes her gain weight. We will assess PFTs, if stable that would be very reassuring.  HRCT seems to suggest burnt out disease with calcified lymph nodes and some fibrosis but her intermittent cough makes me wonder if there is still some active disease

## 2020-10-19 NOTE — Progress Notes (Signed)
   Subjective:    Patient ID: Gail Nguyen, female    DOB: 1945/09/19, 75 y.o.   MRN: [id number]  HPI  75 yo  never smokerfor FUof sarcoidosis . Diagnosis was made more than 30 years agoonan eye exam . She developed skin nodules and saw a dermatologist and this was biopsied5/2019 which confirmed diagnosis of sarcoidosis.   Prednisone taper 04/2018 and again 05/2019 for sarcoid flare HRCT 06/2019 appeared stable She still has an intermittent cough, grades this is 3/10, this seems to come and go. Denies dyspnea No new skin lesions or eye symptoms    Significant tests/ events reviewed 09/2017 skin bx fore arm >> non caseating granuloma   HRCT 06/2019  stable pattern of fibrosis since 2019 small pericardial effusion, central traction bronchiectasis with mild associated interstitial coarsening.   CT chest 2003calcified mediastinal lymph nodes Interstitial lung disease seems to date back to 2006. HRCT8/2019-calcified mediastinal lymph nodes, bilateral upper lobe changes consistent with sarcoid.  PFTs 02/2018-no evidence of airway obstruction, FVC 93%, TLC 78%, DLCO 67% but corrects with alveolar volume? Changes of obesity   Review of Systems neg for any significant sore throat, dysphagia, itching, sneezing, nasal congestion or excess/ purulent secretions, fever, chills, sweats, unintended wt loss, pleuritic or exertional cp, hempoptysis, orthopnea pnd or change in chronic leg swelling. Also denies presyncope, palpitations, heartburn, abdominal pain, nausea, vomiting, diarrhea or change in bowel or urinary habits, dysuria,hematuria, rash, arthralgias, visual complaints, headache, numbness weakness or ataxia.     Objective:   Physical Exam   Gen. Pleasant, obese, in no distress ENT - no lesions, no post nasal drip Neck: No JVD, no thyromegaly, no carotid bruits Lungs: no use of accessory muscles, no dullness to percussion, decreased without rales or rhonchi   Cardiovascular: Rhythm regular, heart sounds  normal, no murmurs or gallops, no peripheral edema Musculoskeletal: No deformities, no cyanosis or clubbing , no tremors        Assessment & Plan:

## 2020-10-19 NOTE — Patient Instructions (Signed)
CXR today Schedule PFTs

## 2020-10-21 ENCOUNTER — Other Ambulatory Visit: Payer: Self-pay

## 2020-10-21 ENCOUNTER — Other Ambulatory Visit
Admission: RE | Admit: 2020-10-21 | Discharge: 2020-10-21 | Disposition: A | Discharge status: Home / Self Care | Payer: Medicare Other | Source: Ambulatory Visit | Attending: Pulmonary Disease | Admitting: Pulmonary Disease

## 2020-10-21 DIAGNOSIS — Z01812 Encounter for preprocedural laboratory examination: Secondary | ICD-10-CM | POA: Insufficient documentation

## 2020-10-21 DIAGNOSIS — Z20822 Contact with and (suspected) exposure to covid-19: Secondary | ICD-10-CM | POA: Diagnosis not present

## 2020-10-21 LAB — SARS CORONAVIRUS 2 (TAT 6-24 HRS): SARS Coronavirus 2: NEGATIVE

## 2020-10-22 ENCOUNTER — Ambulatory Visit (INDEPENDENT_AMBULATORY_CARE_PROVIDER_SITE_OTHER): Payer: Medicare Other | Admitting: Pulmonary Disease

## 2020-10-22 DIAGNOSIS — D869 Sarcoidosis, unspecified: Secondary | ICD-10-CM

## 2020-10-22 LAB — PULMONARY FUNCTION TEST
DL/VA % pred: 114 %
DL/VA: 4.78 ml/min/mmHg/L
DLCO cor % pred: 66 %
DLCO cor: 11.95 ml/min/mmHg
DLCO unc % pred: 66 %
DLCO unc: 11.95 ml/min/mmHg
FEF 25-75 Post: 1 L/sec
FEF 25-75 Pre: 2.4 L/sec
FEF2575-%Change-Post: -58 %
FEF2575-%Pred-Post: 69 %
FEF2575-%Pred-Pre: 166 %
FEV1-%Change-Post: -12 %
FEV1-%Pred-Post: 92 %
FEV1-%Pred-Pre: 106 %
FEV1-Post: 1.47 L
FEV1-Pre: 1.69 L
FEV1FVC-%Change-Post: -1 %
FEV1FVC-%Pred-Pre: 111 %
FEV6-%Change-Post: -11 %
FEV6-%Pred-Post: 89 %
FEV6-%Pred-Pre: 100 %
FEV6-Post: 1.74 L
FEV6-Pre: 1.98 L
FEV6FVC-%Pred-Post: 104 %
FEV6FVC-%Pred-Pre: 104 %
FVC-%Change-Post: -11 %
FVC-%Pred-Post: 85 %
FVC-%Pred-Pre: 96 %
FVC-Post: 1.74 L
FVC-Pre: 1.98 L
Post FEV1/FVC ratio: 84 %
Post FEV6/FVC ratio: 100 %
Pre FEV1/FVC ratio: 85 %
Pre FEV6/FVC Ratio: 100 %
RV % pred: 99 %
RV: 2.14 L
TLC % pred: 85 %
TLC: 4.04 L

## 2020-10-22 NOTE — Progress Notes (Signed)
PFT done today. 

## 2020-10-26 ENCOUNTER — Telehealth: Payer: Self-pay | Admitting: Pulmonary Disease

## 2020-10-26 ENCOUNTER — Telehealth: Payer: Self-pay

## 2020-10-26 DIAGNOSIS — D869 Sarcoidosis, unspecified: Secondary | ICD-10-CM

## 2020-10-26 NOTE — Telephone Encounter (Signed)
   LMTCB regarding CXR results.

## 2020-10-27 NOTE — Telephone Encounter (Signed)
Left message for patient to call back  

## 2020-10-27 NOTE — Telephone Encounter (Signed)
Duplicate encounter. Closing encounter.

## 2020-10-27 NOTE — Telephone Encounter (Signed)
Pt called the office and I went over the results of the cxr with her. Pt was fine with [place] ordering HRCT to be done and specified for it to be done next week if able as she was getting ready to go out of town 6/28. Order placed. Nothing further needed.

## 2020-10-29 NOTE — Progress Notes (Signed)
Called and went over PFT results per Dr Alva with patient. All questions answered and patient expressed full understanding. Nothing further needed at this time.

## 2020-11-02 ENCOUNTER — Ambulatory Visit (INDEPENDENT_AMBULATORY_CARE_PROVIDER_SITE_OTHER)
Admission: RE | Admit: 2020-11-02 | Discharge: 2020-11-02 | Disposition: A | Discharge status: Home / Self Care | Payer: Medicare Other | Source: Ambulatory Visit | Attending: Pulmonary Disease | Admitting: Pulmonary Disease

## 2020-11-02 ENCOUNTER — Other Ambulatory Visit: Payer: Self-pay

## 2020-11-02 DIAGNOSIS — D869 Sarcoidosis, unspecified: Secondary | ICD-10-CM | POA: Diagnosis not present

## 2020-11-03 NOTE — Progress Notes (Signed)
Spoke with pt and notified of results per Dr. Alva. Pt verbalized understanding and denied any questions. 

## 2020-11-24 NOTE — Progress Notes (Signed)
Dr. Sabra Heck reviewed pt's history and recent pulmonologist visit in June.  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  RN attempted to contact pt, and left a detail VM message.  RN will touch base with Dr. Sabra Heck once she speaks with pt.

## 2020-11-25 ENCOUNTER — Encounter (HOSPITAL_BASED_OUTPATIENT_CLINIC_OR_DEPARTMENT_OTHER): Payer: Self-pay | Admitting: Obstetrics and Gynecology

## 2020-11-25 ENCOUNTER — Other Ambulatory Visit: Payer: Self-pay

## 2020-11-29 ENCOUNTER — Encounter (HOSPITAL_BASED_OUTPATIENT_CLINIC_OR_DEPARTMENT_OTHER)
Admission: RE | Admit: 2020-11-29 | Discharge: 2020-11-29 | Disposition: A | Discharge status: Home / Self Care | Payer: Medicare Other | Source: Ambulatory Visit | Attending: Obstetrics and Gynecology | Admitting: Obstetrics and Gynecology

## 2020-11-29 ENCOUNTER — Other Ambulatory Visit: Payer: Self-pay | Admitting: Obstetrics and Gynecology

## 2020-11-29 DIAGNOSIS — Z01818 Encounter for other preprocedural examination: Secondary | ICD-10-CM | POA: Diagnosis present

## 2020-11-29 LAB — BASIC METABOLIC PANEL
Anion gap: 8 (ref 5–15)
BUN: 21 mg/dL (ref 8–23)
CO2: 26 mmol/L (ref 22–32)
Calcium: 9.3 mg/dL (ref 8.9–10.3)
Chloride: 104 mmol/L (ref 98–111)
Creatinine, Ser: 1.09 mg/dL — ABNORMAL HIGH (ref 0.44–1.00)
GFR, Estimated: 53 mL/min — ABNORMAL LOW (ref >60)
Glucose, Bld: 108 mg/dL — ABNORMAL HIGH (ref 70–99)
Potassium: 4.7 mmol/L (ref 3.5–5.1)
Sodium: 138 mmol/L (ref 135–145)

## 2020-11-29 NOTE — H&P (Deleted)
  The note originally documented on this encounter has been moved the the encounter in which it belongs.  

## 2020-11-29 NOTE — H&P (Signed)
Subjective: Chief Complaint(s):   Preop   HPI:  Isolation Precautions Yes- Pfizer" label="Has patient received COVID-19 vaccination?" propId="[id number]" catId="[id number]" encId="[id number]"Has patient received COVID-19 vaccination? 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 Does patient report new onset of COVID symptoms? No. Has patient or close contact tested positive for COVID-19? No , not in the past 2 weeks.  General 75 yo new pt presents for a preop visit.  Pt has excision of cervical mass/ Hysteroscopy D/C and possible polypectomy scheduled for July 20th, 2022 at 2:30 pm.  The patient was last seen 11/01/2020:  She thought her uterus has dropped and has noticed the dropping of the area for 2-3 weeks prior. On exam she was noted to have uterine prolapse however it was not as severe as perceived. she has a mass on her cervix that makes the prolapse seem more pronounced.  She was sent for Consultation by PCP Dr. Sueanne Margarita. [facility]. Pt had a colonoscopy on 01/05/2017 which revealed polyps in the proximal ascending and recto-sigmoid colon. Repeat in 5 or 10 yrs, depending on if the pathology report reveals adenomatous tissue. Pt notes she delivered to her two children vaginally. Her children are now 29 and 44 years old at this time. Pt reported she has tried manually pushing the fallen area up in the vaginal canal.  Pelvic ultrasound 11/04/2020 uterus measures 8 cm x 4.7 cm x 5.3 cm. Endometrium is 5.6 mm. 3 fibroids are noted    Current Medication: Taking  amLODIPine Besylate 5 MG Tablet 1 tablet Orally Once a day.     Carvedilol 25 MG Tablet 1 tablet Orally twice a day.     Fish Oil 1200 MG Capsule Delayed Release 1 capsule Orally Once a day.     MVI 1 capsule by mouth once a day.     Omeprazole 40 MG Capsule Delayed Release 1 capsule 30 minutes before morning meal Orally Once a day.     Valsartan-hydroCHLOROthiazide 320-25 MG Tablet 1 tablet Orally  Once a day.   Discontinued  Aspirin 81 MG Tablet Chewable 1 tablet Orally Once a day.     Medication List reviewed and reconciled with the patient.  Medical History:  hypertension      Allergies/Intolerance: N.K.D.A. Gyn History:  Sexual activity not currently sexually active. Periods : postmenopausal. LMP unsure. Denies Birth control. Last pap smear date unsure. Last mammogram date 08/2020- per pt. Denies Abnormal pap smear. Denies STD.   OB History:  Number of pregnancies 2. Pregnancy # 1 vaginal delivery, boy. Pregnancy # 2 vaginal delivery, girl.   Surgical History:  breast cystectomy, left     left foot bunionectomy     colonoscopy 2003/2013/2018   Hospitalization:  No Hospitalization History.   Family History:  Father: deceased, diagnosed with Coronary artery disease    Mother: deceased, Kidney problems    Paternal [place] Father: deceased    Paternal Grand Mother: deceased    Maternal Grand Father: deceased    Maternal Grand Mother: deceased   Denies fam hx colon cancer, colon polyps or liver disease.  Social History: General Tobacco use cigarettes: Never smoked, Tobacco history last updated 11/18/2020, Vaping No.  no EXPOSURE TO PASSIVE SMOKE.  no Alcohol.  Caffeine: yes, 1 serving daily.  no Recreational drug use.  Marital Status: married, Calvin.  Children: Boys, 1, girls, 1.  ROS: CONSTITUTIONAL No" label="Chills" value="" options="no,yes" propid="91" itemid="193425" categoryid="10464" encounterid="[id number]"Chills No. No" label="Fatigue" value="" options="no,yes" propid="91" itemid="172899" categoryid="10464" encounterid="[id number]"Fatigue No. No" label="Fever" value="" options="no,yes" propid="91" itemid="10467"  categoryid="10464" encounterid="[id number]"Fever No. No" label="Night sweats" value="" options="no,yes" propid="91" itemid="193426" categoryid="10464" encounterid="[id number]"Night sweats No. No" label="Recent travel outside [place]" value="" options="no,yes"  propid="91" itemid="444261" categoryid="10464" encounterid="[id number]"Recent travel outside [place] No. No" label="Sweats" value="" options="no,yes" propid="91" itemid="193427" categoryid="10464" encounterid="[id number]"Sweats No. No" label="Weight change" value="" options="no,yes" propid="91" itemid="194825" categoryid="10464" encounterid="[id number]"Weight change No.  OPHTHALMOLOGY no" label="Blurring of vision" value="" options="no,yes" propid="91" itemid="12520" categoryid="12516" encounterid="[id number]"Blurring of vision no. no" label="Change in vision" value="" options="no,yes" propid="91" itemid="193469" categoryid="12516" encounterid="[id number]"Change in vision no. no" label="Double vision" value="" options="no,yes" propid="91" itemid="194379" categoryid="12516" encounterid="[id number]"Double vision no.  ENT no" label="Dizziness" value="" options="no,yes" propid="91" itemid="193612" categoryid="10481" encounterid="[id number]"Dizziness no. Nose bleeds no. Sore throat no. Teeth pain no.  ALLERGY no" label="Hives" value="" options="no,yes" propid="91" itemid="202589" categoryid="138152" encounterid="[id number]"Hives no.  CARDIOLOGY no" label="Chest pain" value="" options="no,yes" propid="91" itemid="193603" categoryid="10488" encounterid="[id number]"Chest pain no. no" label="High blood pressure" value="" options="no,yes" propid="91" itemid="199089" categoryid="10488" encounterid="[id number]"High blood pressure no. no" label="Irregular heart beat" value="" options="no,yes" propid="91" itemid="202598" categoryid="10488" encounterid="[id number]"Irregular heart beat no. no" label="Leg edema" value="" options="no,yes" propid="91" itemid="10491" categoryid="10488" encounterid="[id number]"Leg edema no. no" label="Palpitations" value="" options="no,yes" propid="91" itemid="10490" categoryid="10488" encounterid="[id number]"Palpitations no.  RESPIRATORY no" label="Shortness of breath" value="" options="no" propid="91" itemid="270013"  categoryid="138132" encounterid="[id number]"Shortness of breath no. no" label="Cough" value="" options="no,yes" propid="91" itemid="172745" categoryid="138132" encounterid="[id number]"Cough no. no" label="Wheezing" value="" options="no,yes" propid="91" itemid="193621" categoryid="138132" encounterid="14036158"Wheezing no.  UROLOGY no" label="Pain with urination" value="" options="no,yes" propid="91" itemid="194377" categoryid="138166" encounterid="[id number]"Pain with urination no. no" label="Urinary urgency" value="" options="no,yes" propid="91" itemid="193493" categoryid="138166" encounterid="[id number]"Urinary urgency no. no" label="Urinary frequency" value="" options="no,yes" propid="91" itemid="193492" categoryid="138166" encounterid="[id number]"Urinary frequency no. no" label="Urinary incontinence" value="" options="no,yes" propid="91" itemid="138171" categoryid="138166" encounterid="14036158"Urinary incontinence no. No" label="Difficulty urinating" value="" options="no,yes" propid="91" itemid="138167" categoryid="138166" encounterid="14036158"Difficulty urinating No. No" label="Blood in urine" value="" options="no,yes" propid="91" itemid="138168" categoryid="138166" encounterid="14036158"Blood in urine No.  GASTROENTEROLOGY no" label="Abdominal pain" value="" options="no,yes" propid="91" itemid="10496" categoryid="10494" encounterid="[id number]"Abdominal pain no. no" label="Appetite change" value="" options="no,yes" propid="91" itemid="193447" categoryid="10494" encounterid="14036158"Appetite change no. no" label="Bloating/belching" value="" options="no,yes" propid="91" itemid="193448" categoryid="10494" encounterid="14036158"Bloating/belching no. no" label="Blood in stool or on toilet paper" value="" options="no,yes" propid="91" itemid="10503" categoryid="10494" encounterid="14036158"Blood in stool or on toilet paper no. no" label="Change in bowel movements" value="" options="no,yes" propid="91" itemid="199106"  categoryid="10494" encounterid="14036158"Change in bowel movements no. no" label="Constipation" value="" options="no,yes" propid="91" itemid="10501" categoryid="10494" encounterid="14036158"Constipation no. no" label="Diarrhea" value="" options="no,yes" propid="91" itemid="10502" categoryid="10494" encounterid="14036158"Diarrhea no. no" label="Difficulty swallowing" value="" options="no,yes" propid="91" itemid="199104" categoryid="10494" encounterid="14036158"Difficulty swallowing no. no" label="Nausea" value="" options="no,yes" propid="91" itemid="10499" categoryid="10494" encounterid="14036158"Nausea no.  FEMALE REPRODUCTIVE no" label="Vulvar pain" value="" options="no,yes" propid="91" itemid="453725" categoryid="10525" encounterid="14036158"Vulvar pain no. no" label="Vulvar rash" value="" options="no,yes" propid="91" itemid="453726" categoryid="10525" encounterid="14036158"Vulvar rash no. no" label="Abnormal vaginal bleeding" value="" options="no, yes" propid="91" itemid="444315" categoryid="10525" encounterid="14036158"Abnormal vaginal bleeding no. no" label="Breast pain" value="" options="no,yes" propid="91" itemid="186083" categoryid="10525" encounterid="[id number]"Breast pain no. no" label="Nipple discharge" value="" options="no,yes" propid="91" itemid="186084" categoryid="10525" encounterid="[id number]"Nipple discharge no. no" label="Pain with intercourse" value="" options="no,yes" propid="91" itemid="275823" categoryid="10525" encounterid="[id number]"Pain with intercourse no. no" label="Pelvic pain" value="" options="no,yes" propid="91" itemid="186082" categoryid="10525" encounterid="14036158"Pelvic pain no. no" label="Unusual vaginal discharge" value="" options="no,yes" propid="91" itemid="278230" categoryid="10525" encounterid="14036158"Unusual vaginal discharge no. no" label="Vaginal itching" value="" options="no,yes" propid="91" itemid="278942" categoryid="10525" encounterid="14036158"Vaginal itching no.   MUSCULOSKELETAL no" label="Muscle aches" value="" options="no,yes" propid="91" itemid="193461" categoryid="10514" encounterid="14036158"Muscle aches no.  NEUROLOGY no" label="Headache" value="" options="no,yes" propid="91" itemid="12513" categoryid="12512" encounterid="14036158"Headache no. no" label="Tingling/numbness" value="" options="no,yes" propid="91" itemid="12514" categoryid="12512" encounterid="14036158"Tingling/numbness no. no" label="Weakness" value="" options="no,yes" propid="91" itemid="193468" categoryid="12512" encounterid="14036158"Weakness no.  PSYCHOLOGY no" label="Depression" value="" options="" propid="91" itemid="275919" categoryid="10520" encounterid="14036158"Depression no. no" label="Anxiety" value="" options="no,yes" propid="91" itemid="172748" categoryid="10520" encounterid="14036158"Anxiety no. no" label="Nervousness" value="" options="no,yes" propid="91" itemid="199158" categoryid="10520" encounterid="14036158"Nervousness no. no" label="Sleep disturbances" value="" options="no,yes" propid="91" itemid="12502" categoryid="10520" encounterid="14036158"Sleep disturbances no. no " label="Suicidal ideation" value="" options="no,yes" propid="91" itemid="72718" categoryid="10520" encounterid="14036158"Suicidal ideation no .  ENDOCRINOLOGY  no" label="Excessive thirst" value="" options="no,yes" propid="91" itemid="194628" categoryid="12508" encounterid="14036158"Excessive thirst no. no" label="Excessive urination" value="" options="no,yes" propid="91" itemid="196285" categoryid="12508" encounterid="14036158"Excessive urination no. no" label="Hair loss" value="" options="no, yes" propid="91" itemid="444314" categoryid="12508" encounterid="14036158"Hair loss no. no" label="Heat or cold intolerance" value="" options="" propid="91" itemid="447284" categoryid="12508" encounterid="14036158"Heat or cold intolerance no.  HEMATOLOGY/LYMPH no" label="Abnormal bleeding" value="" options="no,yes"  propid="91" itemid="199152" categoryid="138157" encounterid="14036158"Abnormal bleeding no. no" label="Easy bruising" value="" options="no,yes" propid="91" itemid="170653" categoryid="138157" encounterid="14036158"Easy bruising no. no" label="Swollen glands" value="" options="no,yes" propid="91" itemid="138158" categoryid="138157" encounterid="14036158"Swollen glands no.  DERMATOLOGY no" label="New/changing skin lesion" value="" options="no,yes" propid="91" itemid="199126" categoryid="12503" encounterid="14036158"New/changing skin lesion no. no" label="Rash" value="" options="no,yes" propid="91" itemid="12504" categoryid="12503" encounterid="14036158"Rash no. no" label="Sores" value="" options="" propid="91" itemid="444313" categoryid="12503" encounterid="14036158"Sores no.  Negative except as stated in HPI.  Objective: Vitals: Wt 199.2, Wt change -2.4 lb, Ht 63.5, BMI 34.73, BP sitting 140/60.  Past Results: Examination:  General Examination alert, oriented, NAD" label="CONSTITUTIONAL:" categoryPropId="10089" examid="193638"CONSTITUTIONAL: alert, oriented, NAD.  moist, warm" label="SKIN:" categoryPropId="10109" examid="193638"SKIN: moist, warm.  Conjunctiva clear" label="EYES:" categoryPropId="21468" examid="193638"EYES: Conjunctiva clear.  LUNGS: good I:E efffort noted , good air entry bilaterally.  HEART: heart sounds are normal, rhythm is regular, no murmur.  soft, non-tender/non-distended, bowel sounds present" label="ABDOMEN:" categoryPropId="88" examid="193638"ABDOMEN: soft, non-tender/non-distended, bowel sounds present.  normal external genitalia, labia - unremarkable, vagina - pink moist mucosa, no lesions or abnormal discharge, cervix - approximately 3-4 cm mass arrising from the cervix. adnexa - no masses or tenderness, uterus - nontender and normal size on palpation" label="FEMALE GENITOURINARY:" categoryPropId="13414" examid="193638"FEMALE GENITOURINARY: normal external genitalia, labia  - unremarkable, vagina - pink moist mucosa, no lesions or abnormal discharge, cervix - approximately 3-4 cm mass arrising from the cervix. adnexa - no masses or tenderness, uterus - nontender and normal size on palpation.  affect normal, good eye contact" label="PSYCH:" categoryPropId="16316" examid="193638"PSYCH: affect normal, good eye contact.  Physical Examination: Chaperone present Whitfield,Dia 11/18/2020 03:07:13 PM &gt; , for pelvic exam" label="Chaperone present" itemId="[id number]" categoryId="[id number]"Chaperone present Whitfield,Dia 11/18/2020 03:07:13 PM > , for pelvic exam.  Pt aware of scribe services today.   Assessment: Assessment:  Cervical mass - N88.8 (Primary)     Uterine prolapse - N81.4     Plan: Treatment: Cervical mass Notes:  Pt has Hysteroscopy D/C and possible polypectomy and removal of cervical mass scheduled for July 20th, 2022.  Pt is advised she will be able to return home the same day. Discussed risks of hysteroscopy including but not limited to infection, bleeding, possible perforation of the uterus, with the need for further surgery. Discussed risk of blood transfusion and risk of HIV or hep B&C (1 out of 2 million and 1 out of 200,000, respectively) with blood transfusion. Pt is aware of risks and desires blood transfusion if needed. Pt advised to avoid NSAIDs (Aspirin, Aleve, Advil, Ibuprofen, Motrin) from now until surgery given risk of bleeding during surgery. She may take Tylenol for pain management. She is advised to avoid eating or drinking starting midnight prior to surgery. Pt is advised that she may have watery discharge or cramping after surgery. Discussed post-surgery avoidance of driving for 24 hours and avoidance for 2 weeks after procedure. Follow up in 4 weeks for 2 wk post-op visit.   Marland Kitchen Uterine prolapse Notes: Grade 2 uterine prolapse that seems worse due to the cervical mass. .. recommend kegel exercises.

## 2020-11-30 ENCOUNTER — Encounter (HOSPITAL_BASED_OUTPATIENT_CLINIC_OR_DEPARTMENT_OTHER)
Admission: RE | Admit: 2020-11-30 | Discharge: 2020-11-30 | Disposition: A | Discharge status: Home / Self Care | Payer: Medicare Other | Source: Ambulatory Visit | Attending: Obstetrics and Gynecology | Admitting: Obstetrics and Gynecology

## 2020-11-30 DIAGNOSIS — Z01812 Encounter for preprocedural laboratory examination: Secondary | ICD-10-CM | POA: Insufficient documentation

## 2020-11-30 DIAGNOSIS — Z01818 Encounter for other preprocedural examination: Secondary | ICD-10-CM | POA: Diagnosis not present

## 2020-11-30 DIAGNOSIS — N889 Noninflammatory disorder of cervix uteri, unspecified: Secondary | ICD-10-CM | POA: Diagnosis present

## 2020-11-30 DIAGNOSIS — N841 Polyp of cervix uteri: Secondary | ICD-10-CM | POA: Diagnosis not present

## 2020-11-30 DIAGNOSIS — Z79899 Other long term (current) drug therapy: Secondary | ICD-10-CM | POA: Diagnosis not present

## 2020-11-30 DIAGNOSIS — D259 Leiomyoma of uterus, unspecified: Secondary | ICD-10-CM | POA: Diagnosis not present

## 2020-11-30 DIAGNOSIS — N814 Uterovaginal prolapse, unspecified: Secondary | ICD-10-CM | POA: Diagnosis not present

## 2020-11-30 DIAGNOSIS — N888 Other specified noninflammatory disorders of cervix uteri: Secondary | ICD-10-CM | POA: Diagnosis not present

## 2020-11-30 LAB — CBC
HCT: 40.8 % (ref 36.0–46.0)
Hemoglobin: 13.8 g/dL (ref 12.0–15.0)
MCH: 29.3 pg (ref 26.0–34.0)
MCHC: 33.8 g/dL (ref 30.0–36.0)
MCV: 86.6 fL (ref 80.0–100.0)
Platelets: 170 10*3/uL (ref 150–400)
RBC: 4.71 MIL/uL (ref 3.87–5.11)
RDW: 14.3 % (ref 11.5–15.5)
WBC: 5.8 10*3/uL (ref 4.0–10.5)
nRBC: 0 % (ref 0.0–0.2)

## 2020-12-01 ENCOUNTER — Encounter (HOSPITAL_BASED_OUTPATIENT_CLINIC_OR_DEPARTMENT_OTHER)
Admission: RE | Disposition: A | Discharge status: Home / Self Care | Payer: Self-pay | Source: Home / Self Care | Attending: Obstetrics and Gynecology

## 2020-12-01 ENCOUNTER — Ambulatory Visit (HOSPITAL_BASED_OUTPATIENT_CLINIC_OR_DEPARTMENT_OTHER): Payer: Medicare Other | Admitting: Anesthesiology

## 2020-12-01 ENCOUNTER — Ambulatory Visit (HOSPITAL_BASED_OUTPATIENT_CLINIC_OR_DEPARTMENT_OTHER)
Admission: RE | Admit: 2020-12-01 | Discharge: 2020-12-01 | Disposition: A | Discharge status: Home / Self Care | Payer: Medicare Other | Attending: Obstetrics and Gynecology | Admitting: Obstetrics and Gynecology

## 2020-12-01 ENCOUNTER — Other Ambulatory Visit: Payer: Self-pay

## 2020-12-01 ENCOUNTER — Encounter (HOSPITAL_BASED_OUTPATIENT_CLINIC_OR_DEPARTMENT_OTHER): Payer: Self-pay | Admitting: Obstetrics and Gynecology

## 2020-12-01 DIAGNOSIS — N814 Uterovaginal prolapse, unspecified: Secondary | ICD-10-CM | POA: Diagnosis not present

## 2020-12-01 DIAGNOSIS — N841 Polyp of cervix uteri: Secondary | ICD-10-CM | POA: Diagnosis not present

## 2020-12-01 DIAGNOSIS — N888 Other specified noninflammatory disorders of cervix uteri: Secondary | ICD-10-CM | POA: Diagnosis not present

## 2020-12-01 DIAGNOSIS — Z79899 Other long term (current) drug therapy: Secondary | ICD-10-CM | POA: Diagnosis not present

## 2020-12-01 DIAGNOSIS — D259 Leiomyoma of uterus, unspecified: Secondary | ICD-10-CM | POA: Insufficient documentation

## 2020-12-01 HISTORY — PX: HYSTEROSCOPY WITH D & C: SHX1775

## 2020-12-01 HISTORY — DX: Sarcoidosis of lung: D86.0

## 2020-12-01 HISTORY — PX: CERVICAL POLYPECTOMY: SHX88

## 2020-12-01 HISTORY — DX: Essential (primary) hypertension: I10

## 2020-12-01 SURGERY — POLYPECTOMY, CERVIX
Anesthesia: General | Site: Uterus

## 2020-12-01 MED ORDER — FENTANYL CITRATE (PF) 100 MCG/2ML IJ SOLN
INTRAMUSCULAR | Status: AC
  Filled 2020-12-01: qty 2

## 2020-12-01 MED ORDER — ACETAMINOPHEN 500 MG PO TABS
1000.0000 mg | ORAL_TABLET | ORAL | Status: AC
  Administered 2020-12-01: 1000 mg via ORAL

## 2020-12-01 MED ORDER — LIDOCAINE 2% (20 MG/ML) 5 ML SYRINGE
INTRAMUSCULAR | Status: DC | PRN
  Administered 2020-12-01: 100 mg via INTRAVENOUS

## 2020-12-01 MED ORDER — OXYCODONE HCL 5 MG/5ML PO SOLN
5.0000 mg | Freq: Once | ORAL | Status: DC | PRN
Start: 2020-12-01 — End: 2020-12-01

## 2020-12-01 MED ORDER — ONDANSETRON HCL 4 MG/2ML IJ SOLN
INTRAMUSCULAR | Status: AC
  Filled 2020-12-01: qty 2

## 2020-12-01 MED ORDER — BUPIVACAINE HCL (PF) 0.25 % IJ SOLN
INTRAMUSCULAR | Status: DC | PRN
  Administered 2020-12-01: 20 mL

## 2020-12-01 MED ORDER — DEXAMETHASONE SODIUM PHOSPHATE 4 MG/ML IJ SOLN
INTRAMUSCULAR | Status: DC | PRN
  Administered 2020-12-01: 5 mg via INTRAVENOUS

## 2020-12-01 MED ORDER — ONDANSETRON HCL 4 MG/2ML IJ SOLN: 4.0000 mg | Freq: Once | INTRAMUSCULAR | Status: DC | PRN

## 2020-12-01 MED ORDER — ACETAMINOPHEN 500 MG PO TABS
ORAL_TABLET | ORAL | Status: AC
  Filled 2020-12-01: qty 2

## 2020-12-01 MED ORDER — FENTANYL CITRATE (PF) 100 MCG/2ML IJ SOLN: 25.0000 ug | INTRAMUSCULAR | Status: DC | PRN

## 2020-12-01 MED ORDER — HYDROCODONE-ACETAMINOPHEN 5-325 MG PO TABS: 1.0000 | ORAL_TABLET | Freq: Four times a day (QID) | ORAL | 0 refills | Status: DC | PRN

## 2020-12-01 MED ORDER — SODIUM CHLORIDE 0.9 % IR SOLN
Status: DC | PRN
  Administered 2020-12-01: 3000 mL

## 2020-12-01 MED ORDER — LACTATED RINGERS IV SOLN: INTRAVENOUS | Status: DC

## 2020-12-01 MED ORDER — LIDOCAINE HCL (PF) 2 % IJ SOLN
INTRAMUSCULAR | Status: AC
  Filled 2020-12-01: qty 5

## 2020-12-01 MED ORDER — AMISULPRIDE (ANTIEMETIC) 5 MG/2ML IV SOLN: 10.0000 mg | Freq: Once | INTRAVENOUS | Status: DC | PRN

## 2020-12-01 MED ORDER — ONDANSETRON HCL 4 MG/2ML IJ SOLN
INTRAMUSCULAR | Status: DC | PRN
  Administered 2020-12-01: 4 mg via INTRAVENOUS

## 2020-12-01 MED ORDER — IBUPROFEN 600 MG PO TABS: 600.0000 mg | ORAL_TABLET | Freq: Four times a day (QID) | ORAL | 0 refills | Status: DC | PRN

## 2020-12-01 MED ORDER — PROPOFOL 10 MG/ML IV BOLUS
INTRAVENOUS | Status: AC
  Filled 2020-12-01: qty 20

## 2020-12-01 MED ORDER — FENTANYL CITRATE (PF) 100 MCG/2ML IJ SOLN
INTRAMUSCULAR | Status: DC | PRN
  Administered 2020-12-01: 50 ug via INTRAVENOUS
  Administered 2020-12-01 (×2): 25 ug via INTRAVENOUS

## 2020-12-01 MED ORDER — EPHEDRINE SULFATE 50 MG/ML IJ SOLN
INTRAMUSCULAR | Status: DC | PRN
  Administered 2020-12-01 (×2): 10 mg via INTRAVENOUS

## 2020-12-01 MED ORDER — OXYCODONE HCL 5 MG PO TABS: 5.0000 mg | ORAL_TABLET | Freq: Once | ORAL | Status: DC | PRN

## 2020-12-01 MED ORDER — PROPOFOL 10 MG/ML IV BOLUS
INTRAVENOUS | Status: DC | PRN
  Administered 2020-12-01: 150 mg via INTRAVENOUS

## 2020-12-01 SURGICAL SUPPLY — 19 items
BLADE SURG 11 STRL SS (BLADE) IMPLANT
CATH ROBINSON RED A/P 16FR (CATHETERS) ×4 IMPLANT
DILATOR CANAL MILEX (MISCELLANEOUS) IMPLANT
ELECT REM PT RETURN 9FT ADLT (ELECTROSURGICAL) ×4
ELECTRODE REM PT RTRN 9FT ADLT (ELECTROSURGICAL) ×3 IMPLANT
GAUZE 4X4 16PLY ~~LOC~~+RFID DBL (SPONGE) ×4 IMPLANT
GLOVE SURG ENC TEXT LTX SZ6.5 (GLOVE) ×4 IMPLANT
GLOVE SURG UNDER POLY LF SZ6.5 (GLOVE) ×4 IMPLANT
GLOVE SURG UNDER POLY LF SZ7 (GLOVE) ×4 IMPLANT
GOWN STRL REUS W/ TWL LRG LVL3 (GOWN DISPOSABLE) ×6 IMPLANT
GOWN STRL REUS W/TWL LRG LVL3 (GOWN DISPOSABLE) ×16 IMPLANT
KIT PROCEDURE FLUENT (KITS) ×4 IMPLANT
PACK VAGINAL MINOR WOMEN LF (CUSTOM PROCEDURE TRAY) ×4 IMPLANT
PAD OB MATERNITY 4.3X12.25 (PERSONAL CARE ITEMS) ×4 IMPLANT
PAD PREP 24X48 CUFFED NSTRL (MISCELLANEOUS) ×4 IMPLANT
SLEEVE SCD COMPRESS KNEE MED (STOCKING) ×4 IMPLANT
SUT VICRYL 0 ENDOLOOP (SUTURE) ×2 IMPLANT
TOWEL GREEN STERILE FF (TOWEL DISPOSABLE) ×8 IMPLANT
TOWEL OR 17X24 6PK STRL BLUE (TOWEL DISPOSABLE) ×8 IMPLANT

## 2020-12-01 NOTE — Discharge Instructions (Signed)
  Post Anesthesia Home Care Instructions  Activity: Get plenty of rest for the remainder of the day. A responsible individual must stay with you for 24 hours following the procedure.  For the next 24 hours, DO NOT: -Drive a car -Paediatric nurse -Drink alcoholic beverages -Take any medication unless instructed by your physician -Make any legal decisions or sign important papers.  Meals: Start with liquid foods such as gelatin or soup. Progress to regular foods as tolerated. Avoid greasy, spicy, heavy foods. If nausea and/or vomiting occur, drink only clear liquids until the nausea and/or vomiting subsides. Call your physician if vomiting continues.  Special Instructions/Symptoms: Your throat may feel dry or sore from the anesthesia or the breathing tube placed in your throat during surgery. If this causes discomfort, gargle with warm salt water. The discomfort should disappear within 24 hours.  If you had a scopolamine patch placed behind your ear for the management of post- operative nausea and/or vomiting:  1. The medication in the patch is effective for 72 hours, after which it should be removed.  Wrap patch in a tissue and discard in the trash. Wash hands thoroughly with soap and water. 2. You may remove the patch earlier than 72 hours if you experience unpleasant side effects which may include dry mouth, dizziness or visual disturbances. 3. Avoid touching the patch. Wash your hands with soap and water after contact with the patch.     No Tylenol until 7:26 pm

## 2020-12-01 NOTE — Op Note (Signed)
12/01/2020  2:54 PM  PATIENT:  Gail Nguyen  75 y.o. female  PRE-OPERATIVE DIAGNOSIS:  Cervical Mass  POST-OPERATIVE DIAGNOSIS:  Cervical Mass  PROCEDURE:  Procedure(s): EXCISION OF CERVICAL MASS (N/A) DILATATION AND CURETTAGE /HYSTEROSCOPY (N/A)  SURGEON:  Surgeon(s) and Role:    Christophe Louis, MD - Primary  PHYSICIAN ASSISTANT:   ASSISTANTS: none   ANESTHESIA:   general  EBL:  10 mL   BLOOD ADMINISTERED:none  DRAINS: none   LOCAL MEDICATIONS USED:  MARCAINE     SPECIMEN:  Source of Specimen:  cervical mass and endocervical currettings   DISPOSITION OF SPECIMEN:  PATHOLOGY  COUNTS:  YES  TOURNIQUET:  * No tourniquets in log *  DICTATION: .Note written in EPIC  PLAN OF CARE: Discharge to home after PACU  PATIENT DISPOSITION:  PACU - hemodynamically stable.   Delay start of Pharmacological VTE agent (>24hrs) due to surgical blood loss or risk of bleeding: not applicable  Findings: Grade 2-3 uterine prolapse. Large 3-4 cm mass arrising from the cervix . Atrophic appearing endometrium no evidence of endometrial polyp   Procedure: Patient was taken to the operating room where she was placed under general anesthesia. She was placed in the dorsal lithotomy position. She was prepped and draped in the usual sterile fashion. A speculum was placed into the vaginal vault. The uterine mass was suture ligated with an endoloop and then excised with the bovie. The anterior lip of the cervix was grasped with a single-tooth tenaculum. Quarter percent Marcaine was injected at the 4 and 8:00 positions of the cervix. The cervix was then sounded to 8 cm. The cervix was dilated to approximately 6 mm.  hysteroscope was inserted. The findings noted above.  There was no sign of uterine  perforation. .The hysteroscope was removed.   Endocervical currettings were obtained. The single-tooth tenaculum was removed from the anterior lip of the cervix. Patient was noted to have bleeding from the  tenaculum site. Silver nitrate was applied and excellent hemostasis was noted. The speculum was removed from the patient's vagina. She was awakened from anesthesia taken care  To the recovery  room awake and in stable condition. Sponge lap and needle counts were correct x2.

## 2020-12-01 NOTE — Anesthesia Preprocedure Evaluation (Signed)
Anesthesia Evaluation  Patient identified by MRN, date of birth, ID band Patient awake    Reviewed: Allergy & Precautions, NPO status , Patient's Chart, lab work & pertinent test results  History of Anesthesia Complications Negative for: history of anesthetic complications  Airway Mallampati: III  TM Distance: >3 FB Neck ROM: Full    Dental  (+) Teeth Intact, Dental Advisory Given   Pulmonary  sarcoidosis   Pulmonary exam normal        Cardiovascular hypertension, Normal cardiovascular exam     Neuro/Psych negative neurological ROS     GI/Hepatic negative GI ROS, Neg liver ROS,   Endo/Other  negative endocrine ROS  Renal/GU negative Renal ROS  negative genitourinary   Musculoskeletal negative musculoskeletal ROS (+)   Abdominal   Peds  Hematology negative hematology ROS (+)   Anesthesia Other Findings   Reproductive/Obstetrics                            Anesthesia Physical Anesthesia Plan  ASA: 2  Anesthesia Plan: General   Post-op Pain Management:    Induction: Intravenous  PONV Risk Score and Plan: 3 and Ondansetron, Dexamethasone, Midazolam and Treatment may vary due to age or medical condition  Airway Management Planned: LMA  Additional Equipment: None  Intra-op Plan:   Post-operative Plan: Extubation in OR  Informed Consent: I have reviewed the patients History and Physical, chart, labs and discussed the procedure including the risks, benefits and alternatives for the proposed anesthesia with the patient or authorized representative who has indicated his/her understanding and acceptance.     Dental advisory given  Plan Discussed with:   Anesthesia Plan Comments:         Anesthesia Quick Evaluation

## 2020-12-01 NOTE — Anesthesia Procedure Notes (Signed)
Procedure Name: LMA Insertion Date/Time: 12/01/2020 2:08 PM Performed by: Maryella Shivers, CRNA Pre-anesthesia Checklist: Patient identified, Emergency Drugs available, Suction available and Patient being monitored Patient Re-evaluated:Patient Re-evaluated prior to induction Oxygen Delivery Method: Circle system utilized Preoxygenation: Pre-oxygenation with 100% oxygen Induction Type: IV induction Ventilation: Mask ventilation without difficulty LMA: LMA inserted LMA Size: 4.0 Number of attempts: 1 Airway Equipment and Method: Bite block Placement Confirmation: positive ETCO2 Tube secured with: Tape Dental Injury: Teeth and Oropharynx as per pre-operative assessment

## 2020-12-01 NOTE — Transfer of Care (Signed)
Immediate Anesthesia Transfer of Care Note  Patient: Gail Nguyen  Procedure(s) Performed: EXCISION OF CERVICAL MASS (Cervix) DILATATION AND CURETTAGE /HYSTEROSCOPY (Uterus)  Patient Location: PACU  Anesthesia Type:General  Level of Consciousness: awake, alert  and oriented  Airway & Oxygen Therapy: Patient Spontanous Breathing and Patient connected to face mask oxygen  Post-op Assessment: Report given to RN and Post -op Vital signs reviewed and stable  Post vital signs: Reviewed and stable  Last Vitals:  Vitals Value Taken Time  BP 150/94 12/01/20 1454  Temp 36.4 C 12/01/20 1454  Pulse 72 12/01/20 1455  Resp 19 12/01/20 1455  SpO2 99 % 12/01/20 1455  Vitals shown include unvalidated device data.  Last Pain:  Vitals:   12/01/20 1321  TempSrc: Oral  PainSc: 0-No pain      Patients Stated Pain Goal: 4 (84/41/71 2787)  Complications: No notable events documented.

## 2020-12-01 NOTE — Anesthesia Postprocedure Evaluation (Signed)
Anesthesia Post Note  Patient: Gail Nguyen  Procedure(s) Performed: EXCISION OF CERVICAL MASS (Cervix) DILATATION AND CURETTAGE /HYSTEROSCOPY (Uterus)     Patient location during evaluation: PACU Anesthesia Type: General Level of consciousness: awake and alert Pain management: pain level controlled Vital Signs Assessment: post-procedure vital signs reviewed and stable Respiratory status: spontaneous breathing, nonlabored ventilation and respiratory function stable Cardiovascular status: blood pressure returned to baseline and stable Postop Assessment: no apparent nausea or vomiting Anesthetic complications: no   No notable events documented.  Last Vitals:  Vitals:   12/01/20 1515 12/01/20 1530  BP: 135/69 (!) 145/72  Pulse: 61 66  Resp: 12 14  Temp:    SpO2: 97% 95%    Last Pain:  Vitals:   12/01/20 1515  TempSrc:   PainSc: 0-No pain                 Lidia Collum

## 2020-12-01 NOTE — H&P (Signed)
Date of Initial H&P: 11/29/2020  History reviewed, patient examined, no change in status, stable for surgery.

## 2020-12-02 ENCOUNTER — Encounter (HOSPITAL_BASED_OUTPATIENT_CLINIC_OR_DEPARTMENT_OTHER): Payer: Self-pay | Admitting: Obstetrics and Gynecology

## 2020-12-03 LAB — SURGICAL PATHOLOGY

## 2021-06-28 ENCOUNTER — Ambulatory Visit: Payer: Medicare Other | Admitting: Podiatry

## 2021-06-28 ENCOUNTER — Encounter: Payer: Self-pay | Admitting: Podiatry

## 2021-06-28 ENCOUNTER — Other Ambulatory Visit: Payer: Self-pay | Admitting: Podiatry

## 2021-06-28 ENCOUNTER — Other Ambulatory Visit: Payer: Self-pay

## 2021-06-28 ENCOUNTER — Ambulatory Visit (INDEPENDENT_AMBULATORY_CARE_PROVIDER_SITE_OTHER): Payer: Medicare Other

## 2021-06-28 DIAGNOSIS — M25571 Pain in right ankle and joints of right foot: Secondary | ICD-10-CM

## 2021-06-28 DIAGNOSIS — M722 Plantar fascial fibromatosis: Secondary | ICD-10-CM

## 2021-06-28 MED ORDER — MELOXICAM 15 MG PO TABS: 15.0000 mg | ORAL_TABLET | Freq: Every day | ORAL | 0 refills | Status: DC

## 2021-06-28 NOTE — Progress Notes (Signed)
°  Subjective:  Patient ID: Gail Nguyen, female    DOB: 04-12-1946,   MRN: [id number]  Chief Complaint  Patient presents with   Foot Pain    Right foot pain - not constant pain is mostly in the mornings    76 y.o. female presents for right arch pain that has been going on for several months. Relates shooting throbbing pain that occurs mostly with first steps after rest. Relates OTC medications have helped. She has dealt with this problem many years ago and resolved with inserts.  . Denies any other pedal complaints. Denies n/v/f/c.   Past Medical History:  Diagnosis Date   Hypertension    Sarcoidosis, lung (HCC)     Objective:  Physical Exam: Vascular: DP/PT pulses 2/4 bilateral. CFT <3 seconds. Normal hair growth on digits. No edema.  Skin. No lacerations or abrasions bilateral feet.  Musculoskeletal: MMT 5/5 bilateral lower extremities in DF, PF, Inversion and Eversion. Deceased ROM in DF of ankle joint. Mildly tender to medial calcaneal tubercle and through the arch of the right foot. No pain to achilles or PT tendon. No pain with calcaneal squeeze.  Neurological: Sensation intact to light touch.   Assessment:   1. Plantar fasciitis of right foot      Plan:  Patient was evaluated and treated and all questions answered. Discussed plantar fasciitis with patient.  X-rays reviewed and discussed with patient. No acute fractures or dislocations noted. Mild spurring noted at inferior calcaneus.  Discussed treatment options including, ice, NSAIDS, supportive shoes, bracing, and stretching. Stretching exercises provided to be done on a daily basis.   Prescription for meloxicam provided and sent to pharmacy. Deferred injection today [employer] dispensed.  Follow-up 6 weeks or sooner if any problems arise. In the meantime, encouraged to call the office with any questions, concerns, change in symptoms.      Lorenda Peck, DPM

## 2021-06-28 NOTE — Patient Instructions (Signed)

## 2021-07-20 ENCOUNTER — Other Ambulatory Visit: Payer: Self-pay | Admitting: Podiatry

## 2021-08-09 ENCOUNTER — Ambulatory Visit: Payer: Medicare Other | Admitting: Podiatry

## 2021-08-29 ENCOUNTER — Other Ambulatory Visit: Payer: Self-pay | Admitting: Internal Medicine

## 2021-08-29 DIAGNOSIS — Z1231 Encounter for screening mammogram for malignant neoplasm of breast: Secondary | ICD-10-CM

## 2021-09-29 ENCOUNTER — Ambulatory Visit
Admission: RE | Admit: 2021-09-29 | Discharge: 2021-09-29 | Disposition: A | Discharge status: Home / Self Care | Payer: Medicare Other | Source: Ambulatory Visit | Attending: Internal Medicine | Admitting: Internal Medicine

## 2021-09-29 DIAGNOSIS — Z1231 Encounter for screening mammogram for malignant neoplasm of breast: Secondary | ICD-10-CM

## 2021-10-20 ENCOUNTER — Encounter: Payer: Self-pay | Admitting: Adult Health

## 2021-10-20 ENCOUNTER — Ambulatory Visit: Payer: Medicare Other | Admitting: Adult Health

## 2021-10-20 DIAGNOSIS — D869 Sarcoidosis, unspecified: Secondary | ICD-10-CM

## 2021-10-20 NOTE — Progress Notes (Signed)
$'@Patient'h$  ID: Gail Nguyen, female    DOB: May 20, 1945, 76 y.o.   MRN: [id number]  Chief Complaint  Patient presents with   Follow-up    Referring provider: Sueanne Margarita, DO  HPI: 76 year old female never smoker followed for sarcoidosis Sarcoidosis diagnosis by more than 30 years ago on eye exam.  She also had a skin biopsy with skin nodules with confirmation of diagnosis of sarcoidosis.  TEST/EVENTS :  09/2017 skin bx fore arm >> non caseating granuloma   CT chest 2003 calcified mediastinal lymph nodes   Interstitial lung disease seems to date back to 2006. HRCT 12/2017-calcified mediastinal lymph nodes, bilateral upper lobe changes consistent with sarcoid.   PFTs 02/2018-no evidence of airway obstruction, FVC 93%, TLC 78%, DLCO 67% but corrects with alveolar volume?  Changes of obesity   High-resolution CT chest November 03, 2020 shows mild pulmonary fibrosis featuring bronchiectasis, architectural distortion and some septal thickening.  Slightly upper lobe predominance.  Not significantly changed to prior exam.,  Numerous calcified mediastinal and bilateral hilar lymph nodes consistent with nodal sarcoidosis.  10/20/2021 Follow up: Sarcoidosis Patient returns for 1 year follow-up.  Patient says overall she is doing well.  Denies any complaints with her breathing.  No increased cough or shortness of breath.  Patient had pulmonary function testing done after last visit.  This was completed on October 22, 2020 that showed no airflow obstruction or restriction.  Stable lung function.  FEV1 106%, ratio 85, FVC 96%, DLCO 66%.  This was similar to 2019.  High-resolution CT chest November 03, 2020 showed stable mild pulmonary fibrosis related to sarcoidosis. Vaccines Flu utd . Says gets pneumonia vaccines at PCP. Covid vaccines utd.  Used to have a cough , has not had a cough >1 year.  Stays active. Independent at home . Drives, cooks, cleans. Not limited by breathing . Walks on treadmill 3 times a week  20 min .    No Known Allergies  Immunization History  Administered Date(s) Administered   Fluad Quad(high Dose 65+) 02/05/2019   Influenza, High Dose Seasonal PF 02/12/2018    Past Medical History:  Diagnosis Date   Hypertension    Sarcoidosis, lung ([place])     Tobacco History: Social History   Tobacco Use  Smoking Status Never  Smokeless Tobacco Never   Counseling given: Not Answered   Outpatient Medications Prior to Visit  Medication Sig Dispense Refill   amLODipine (NORVASC) 5 MG tablet Take by mouth.     carvedilol (COREG) 25 MG tablet      valsartan-hydrochlorothiazide (DIOVAN-HCT) 320-25 MG tablet Take by mouth.     atorvastatin (LIPITOR) 20 MG tablet Take 20 mg by mouth daily.     HYDROcodone-acetaminophen (NORCO/VICODIN) 5-325 MG tablet Take 1-2 tablets by mouth every 6 (six) hours as needed for moderate pain. 15 tablet 0   ibuprofen (ADVIL) 600 MG tablet Take 1 tablet (600 mg total) by mouth every 6 (six) hours as needed. 30 tablet 0   Loratadine 10 MG CAPS Take 10 mg by mouth daily.     meloxicam (MOBIC) 15 MG tablet TAKE 1 TABLET (15 MG TOTAL) BY MOUTH DAILY. 30 tablet 0   No facility-administered medications prior to visit.     Review of Systems:   Constitutional:   No  weight loss, night sweats,  Fevers, chills, fatigue, or  lassitude.  HEENT:   No headaches,  Difficulty swallowing,  Tooth/dental problems, or  Sore throat,  No sneezing, itching, ear ache, nasal congestion, post nasal drip,   CV:  No chest pain,  Orthopnea, PND, swelling in lower extremities, anasarca, dizziness, palpitations, syncope.   GI  No heartburn, indigestion, abdominal pain, nausea, vomiting, diarrhea, change in bowel habits, loss of appetite, bloody stools.   Resp: No shortness of breath with exertion or at rest.  No excess mucus, no productive cough,  No non-productive cough,  No coughing up of blood.  No change in color of mucus.  No wheezing.  No chest wall  deformity  Skin: no rash or lesions.  GU: no dysuria, change in color of urine, no urgency or frequency.  No flank pain, no hematuria   MS:  No joint pain or swelling.  No decreased range of motion.  No back pain.    Physical Exam  BP 128/74 (BP Location: Left Arm, Patient Position: Sitting, Cuff Size: Normal)   Pulse 65   Temp 97.8 F (36.6 C) (Oral)   Ht 5' 3.5" (1.613 m)   Wt 199 lb 6.4 oz (90.4 kg)   SpO2 98%   BMI 34.77 kg/m   GEN: A/Ox3; pleasant , NAD, well nourished    HEENT:  [place]/AT,  , NOSE-clear, THROAT-clear, no lesions, no postnasal drip or exudate noted.   NECK:  Supple w/ fair ROM; no JVD; normal carotid impulses w/o bruits; no thyromegaly or nodules palpated; no lymphadenopathy.    RESP  Clear  P & A; w/o, wheezes/ rales/ or rhonchi. no accessory muscle use, no dullness to percussion  CARD:  RRR, no m/r/g, no peripheral edema, pulses intact, no cyanosis or clubbing.  GI:   Soft & nt; nml bowel sounds; no organomegaly or masses detected.   Musco: Warm bil, no deformities or joint swelling noted.   Neuro: alert, no focal deficits noted.    Skin: Warm, no lesions or rashes    Lab Results:  CBC   BNP No results found for: "BNP"  ProBNP No results found for: "PROBNP"  Imaging:        Latest Ref Rng & Units 10/22/2020   10:58 AM 02/12/2018   11:02 AM  PFT Results  FVC-Pre L 1.98  1.97  P  FVC-Predicted Pre % 96  93  P  FVC-Post L 1.74  1.86  P  FVC-Predicted Post % 85  87  P  Pre FEV1/FVC % % 85  84  P  Post FEV1/FCV % % 84  87  P  FEV1-Pre L 1.69  1.65  P  FEV1-Predicted Pre % 106  102  P  FEV1-Post L 1.47  1.62  P  DLCO uncorrected ml/min/mmHg 11.95  14.66  P  DLCO UNC% % 66  67  P  DLCO corrected ml/min/mmHg 11.95  14.32  P  DLCO COR %Predicted % 66  66  P  DLVA Predicted % 114  98  P  TLC L 4.04  3.73  P  TLC % Predicted % 85  78  P  RV % Predicted % 99  80  P    P Preliminary result    No results found for:  "NITRICOXIDE"      Assessment & Plan:   Sarcoidosis Appears well controled.  Patient is doing well.  Immunizations are reported up-to-date. Patient remains very active.  High-resolution CT chest June 2022 showed stable scarring. PFTs remained stable June 2022. Continue with activity as tolerated.  Follow-up in 1 year On return we will check spirometry with DLCO  and chest x-ray  Plan  Patient Instructions  Continue on current regimen Activity as tolerated Follow-up with Dr. Elsworth Soho or Jae Bruck NP with Spirometry with DLCO and Chest xray .         Rexene Edison, NP 10/20/2021

## 2021-10-20 NOTE — Patient Instructions (Signed)
Continue on current regimen Activity as tolerated Follow-up with Dr. Elsworth Soho or Yazlyn Wentzel NP with Spirometry with DLCO and Chest xray .

## 2021-10-20 NOTE — Assessment & Plan Note (Signed)
Appears well controled.  Patient is doing well.  Immunizations are reported up-to-date. Patient remains very active.  High-resolution CT chest June 2022 showed stable scarring. PFTs remained stable June 2022. Continue with activity as tolerated.  Follow-up in 1 year On return we will check spirometry with DLCO and chest x-ray  Plan  Patient Instructions  Continue on current regimen Activity as tolerated Follow-up with Dr. Elsworth Soho or Izayah Miner NP with Spirometry with DLCO and Chest xray .

## 2021-10-20 NOTE — Addendum Note (Signed)
Addended by: Gavin Potters R on: 10/20/2021 11:00 AM   Modules accepted: Orders

## 2021-12-22 ENCOUNTER — Other Ambulatory Visit (HOSPITAL_COMMUNITY)
Admission: RE | Admit: 2021-12-22 | Discharge: 2021-12-22 | Disposition: A | Discharge status: Home / Self Care | Payer: Medicare Other | Source: Ambulatory Visit | Attending: Obstetrics and Gynecology | Admitting: Obstetrics and Gynecology

## 2021-12-22 ENCOUNTER — Other Ambulatory Visit: Payer: Self-pay | Admitting: Obstetrics and Gynecology

## 2021-12-22 DIAGNOSIS — Z01419 Encounter for gynecological examination (general) (routine) without abnormal findings: Secondary | ICD-10-CM | POA: Diagnosis present

## 2021-12-22 DIAGNOSIS — Z1151 Encounter for screening for human papillomavirus (HPV): Secondary | ICD-10-CM | POA: Diagnosis not present

## 2021-12-26 LAB — CYTOLOGY - PAP
Comment: NEGATIVE
Diagnosis: NEGATIVE
High risk HPV: NEGATIVE

## 2022-01-24 ENCOUNTER — Encounter: Payer: Self-pay | Admitting: Adult Health

## 2022-01-24 ENCOUNTER — Ambulatory Visit (INDEPENDENT_AMBULATORY_CARE_PROVIDER_SITE_OTHER): Payer: Medicare Other | Admitting: Pulmonary Disease

## 2022-01-24 ENCOUNTER — Ambulatory Visit: Payer: Medicare Other | Admitting: Adult Health

## 2022-01-24 ENCOUNTER — Ambulatory Visit (INDEPENDENT_AMBULATORY_CARE_PROVIDER_SITE_OTHER): Payer: Medicare Other

## 2022-01-24 VITALS — BP 138/76 | HR 61 | Temp 97.2°F | Ht 64.0 in | Wt 199.2 lb

## 2022-01-24 DIAGNOSIS — D869 Sarcoidosis, unspecified: Secondary | ICD-10-CM | POA: Diagnosis not present

## 2022-01-24 LAB — PULMONARY FUNCTION TEST
DL/VA % pred: 107 %
DL/VA: 4.46 ml/min/mmHg/L
DLCO cor % pred: 69 %
DLCO cor: 12.34 ml/min/mmHg
DLCO unc % pred: 69 %
DLCO unc: 12.34 ml/min/mmHg
FEF 25-75 Pre: 1.84 L/sec
FEF2575-%Pred-Pre: 119 %
FEV1-%Pred-Pre: 78 %
FEV1-Pre: 1.5 L
FEV1FVC-%Pred-Pre: 110 %
FEV6-%Pred-Pre: 74 %
FEV6-Pre: 1.81 L
FEV6FVC-%Pred-Pre: 105 %
FVC-%Pred-Pre: 70 %
FVC-Pre: 1.81 L
Pre FEV1/FVC ratio: 83 %
Pre FEV6/FVC Ratio: 100 %

## 2022-01-24 MED ORDER — PREDNISONE 10 MG PO TABS: ORAL_TABLET | ORAL | 0 refills | Status: DC

## 2022-01-24 NOTE — Assessment & Plan Note (Signed)
Questionable active sarcoid with increased restrictive changes on PFT and x-ray with mild progressive interstitial and fibrotic changes.  She also has waxing and waning skin nodules.  We will check a's level.  Give short course of steroids.  Clinically she appears to be stable with no perceived flare of symptoms or change in activity level. Patient education given on steroids.  We will have her follow back up in 3 months.  Plan  Patient Instructions  Labs today  Prednisone '20mg'$  daily for 5 days then '10mg'$  daily for 5 days. (Take with food, do not take Advil, mobic , other NSAID)  Continue on current regimen Flu shot in couple of weeks .  Activity as tolerated Follow-up with Dr. Elsworth Soho in 3 months and As needed

## 2022-01-24 NOTE — Patient Instructions (Addendum)
Labs today  Prednisone '20mg'$  daily for 5 days then '10mg'$  daily for 5 days. (Take with food, do not take Advil, mobic , other NSAID)  Continue on current regimen Flu shot in couple of weeks .  Activity as tolerated Follow-up with Dr. Elsworth Soho in 3 months and As needed

## 2022-01-24 NOTE — Progress Notes (Signed)
$'@Patient'Y$  ID: Gail Nguyen, female    DOB: 1945-05-26, 76 y.o.   MRN: [id number]  Chief Complaint  Patient presents with   Follow-up    Referring provider: Sueanne Margarita, DO  HPI: 76 year old female never smoker followed for sarcoidosis-cutaneous, ocular  and pulmonary involvement. Sarcoidosis diagnosed greater than 30 years ago on eye exam.  Previous skin biopsy with skin nodules with confirmation and of diagnosis of sarcoidosis  TEST/EVENTS :  09/2017 skin bx fore arm >> non caseating granuloma   CT chest 2003 calcified mediastinal lymph nodes   Interstitial lung disease seems to date back to 2006. HRCT 12/2017-calcified mediastinal lymph nodes, bilateral upper lobe changes consistent with sarcoid.   PFTs 02/2018-no evidence of airway obstruction, FVC 93%, TLC 78%, DLCO 67% but corrects with alveolar volume?  Changes of obesity    High-resolution CT chest November 03, 2020 shows mild pulmonary fibrosis featuring bronchiectasis, architectural distortion and some septal thickening.  Slightly upper lobe predominance.  Not significantly changed to prior exam.,  Numerous calcified mediastinal and bilateral hilar lymph nodes consistent with nodal sarcoidosis.  PFT October 22, 2020 that showed no airflow obstruction or restriction.  Stable lung function.  FEV1 106%, ratio 85, FVC 96%, DLCO 66%.  This was similar to 2019.  01/24/2022 Follow up: Sarcoid  Patient presents for a follow-up visit.  Patient says overall she is doing well.  She has underlying sarcoidosis. PFTs today show increased restrictive changes  with FEV1, ratio 83, FVC 70%, DLCO 69%.  Diffusing capacity is similar to 2019 and 2022 PFTs.  Patient denies any flare of cough or wheezing.  Says cough is better . No change in endurance or stamina.  Still has skin nodules, seem to come and go. Has noticed several recently on arms.  Patient says she remains very active.  She lives at home.  She does all her ADLs without difficulty.  Walks  on the treadmill 3 times a week for about 20 minutes. Chest x-ray today shows possible mild progression of interstitial and fibrotic lung disease. We reviewed her PFT and x-ray reports in detail.   No Known Allergies  Immunization History  Administered Date(s) Administered   Fluad Quad(high Dose 65+) 02/05/2019   Influenza, High Dose Seasonal PF 02/12/2018    Past Medical History:  Diagnosis Date   Hypertension    Sarcoidosis, lung ([place])     Tobacco History: Social History   Tobacco Use  Smoking Status Never  Smokeless Tobacco Never   Counseling given: Not Answered   Outpatient Medications Prior to Visit  Medication Sig Dispense Refill   amLODipine (NORVASC) 5 MG tablet Take by mouth.     atorvastatin (LIPITOR) 20 MG tablet Take 20 mg by mouth daily.     carvedilol (COREG) 25 MG tablet      ibuprofen (ADVIL) 600 MG tablet Take 1 tablet (600 mg total) by mouth every 6 (six) hours as needed. 30 tablet 0   valsartan-hydrochlorothiazide (DIOVAN-HCT) 320-25 MG tablet Take by mouth.     HYDROcodone-acetaminophen (NORCO/VICODIN) 5-325 MG tablet Take 1-2 tablets by mouth every 6 (six) hours as needed for moderate pain. 15 tablet 0   Loratadine 10 MG CAPS Take 10 mg by mouth daily.     meloxicam (MOBIC) 15 MG tablet TAKE 1 TABLET (15 MG TOTAL) BY MOUTH DAILY. 30 tablet 0   No facility-administered medications prior to visit.     Review of Systems:   Constitutional:   No  weight loss, night  sweats,  Fevers, chills,  +fatigue, or  lassitude.  HEENT:   No headaches,  Difficulty swallowing,  Tooth/dental problems, or  Sore throat,                No sneezing, itching, ear ache, nasal congestion, post nasal drip,   CV:  No chest pain,  Orthopnea, PND, swelling in lower extremities, anasarca, dizziness, palpitations, syncope.   GI  No heartburn, indigestion, abdominal pain, nausea, vomiting, diarrhea, change in bowel habits, loss of appetite, bloody stools.   Resp: No  shortness of breath with exertion or at rest.  No excess mucus, no productive cough,     No coughing up of blood.  No change in color of mucus.  No wheezing.  No chest wall deformity  Skin: no rash or lesions.  GU: no dysuria, change in color of urine, no urgency or frequency.  No flank pain, no hematuria   MS:  No joint pain or swelling.  No decreased range of motion.  No back pain.    Physical Exam  BP 138/76 (BP Location: Left Arm, Patient Position: Sitting, Cuff Size: Normal)   Pulse 61   Temp (!) 97.2 F (36.2 C) (Oral)   Ht '5\' 4"'$  (1.626 m)   Wt 199 lb 3.2 oz (90.4 kg)   SpO2 96%   BMI 34.19 kg/m   GEN: A/Ox3; pleasant , NAD, well nourished    HEENT:  [place]/AT,  NOSE-clear, THROAT-clear, no lesions, no postnasal drip or exudate noted.   NECK:  Supple w/ fair ROM; no JVD; normal carotid impulses w/o bruits; no thyromegaly or nodules palpated; no lymphadenopathy.    RESP  Clear  P & A; w/o, wheezes/ rales/ or rhonchi. no accessory muscle use, no dullness to percussion  CARD:  RRR, no m/r/g, no peripheral edema, pulses intact, no cyanosis or clubbing.  GI:   Soft & nt; nml bowel sounds; no organomegaly or masses detected.   Musco: Warm bil, no deformities or joint swelling noted.   Neuro: alert, no focal deficits noted.    Skin: Warm, small skin nodules along forearms    Lab Results:    BNP No results found for: "BNP"  ProBNP No results found for: "PROBNP"  Imaging: DG Chest 2 View  Result Date: 01/24/2022 CLINICAL DATA:  Sarcoidosis. EXAM: CHEST - 2 VIEW COMPARISON:  10/19/2020 FINDINGS: Stable mild cardiac enlargement. Lungs demonstrate interval probable mild progression interstitial and fibrotic lung disease, although some of this appearance may be secondary to lower lung volumes. There remains evidence of bronchiectasis predominantly in the upper lung zones bilaterally. No pneumothorax, airspace consolidation or pleural fluid identified. IMPRESSION: Some  probable mild progression of interstitial and fibrotic lung disease is suspected but some of this appearance may be secondary to lower lung volumes. Bronchiectasis again noted in the upper lung zones bilaterally. Electronically Signed   By: Aletta Edouard M.D.   On: 01/24/2022 09:31         Latest Ref Rng & Units 01/24/2022    8:47 AM 10/22/2020   10:58 AM 02/12/2018   11:02 AM  PFT Results  FVC-Pre L 1.81  P 1.98  1.97  P  FVC-Predicted Pre % 70  P 96  93  P  FVC-Post L  1.74  1.86  P  FVC-Predicted Post %  85  87  P  Pre FEV1/FVC % % 83  P 85  84  P  Post FEV1/FCV % %  84  87  P  FEV1-Pre L 1.50  P 1.69  1.65  P  FEV1-Predicted Pre % 78  P 106  102  P  FEV1-Post L  1.47  1.62  P  DLCO uncorrected ml/min/mmHg 12.34  P 11.95  14.66  P  DLCO UNC% % 69  P 66  67  P  DLCO corrected ml/min/mmHg 12.34  P 11.95  14.32  P  DLCO COR %Predicted % 69  P 66  66  P  DLVA Predicted % 107  P 114  98  P  TLC L  4.04  3.73  P  TLC % Predicted %  85  78  P  RV % Predicted %  99  80  P    P Preliminary result    No results found for: "NITRICOXIDE"      Assessment & Plan:   Sarcoidosis Questionable active sarcoid with increased restrictive changes on PFT and x-ray with mild progressive interstitial and fibrotic changes.  She also has waxing and waning skin nodules.  We will check a's level.  Give short course of steroids.  Clinically she appears to be stable with no perceived flare of symptoms or change in activity level. Patient education given on steroids.  We will have her follow back up in 3 months.  Plan  Patient Instructions  Labs today  Prednisone '20mg'$  daily for 5 days then '10mg'$  daily for 5 days. (Take with food, do not take Advil, mobic , other NSAID)  Continue on current regimen Flu shot in couple of weeks .  Activity as tolerated Follow-up with Dr. Elsworth Soho in 3 months and As needed           Rexene Edison, NP 01/24/2022

## 2022-01-24 NOTE — Progress Notes (Signed)
Spirometry and Dlco done today. 

## 2022-01-25 LAB — ANGIOTENSIN CONVERTING ENZYME: Angiotensin-Converting Enzyme: 18 U/L (ref 9–67)

## 2022-04-18 ENCOUNTER — Encounter (HOSPITAL_BASED_OUTPATIENT_CLINIC_OR_DEPARTMENT_OTHER): Payer: Self-pay | Admitting: Pulmonary Disease

## 2022-04-18 ENCOUNTER — Ambulatory Visit (INDEPENDENT_AMBULATORY_CARE_PROVIDER_SITE_OTHER): Payer: Medicare Other | Admitting: Pulmonary Disease

## 2022-04-18 ENCOUNTER — Ambulatory Visit (INDEPENDENT_AMBULATORY_CARE_PROVIDER_SITE_OTHER): Payer: Medicare Other

## 2022-04-18 VITALS — BP 134/66 | HR 68 | Temp 98.0°F | Ht 63.5 in | Wt 199.4 lb

## 2022-04-18 DIAGNOSIS — D869 Sarcoidosis, unspecified: Secondary | ICD-10-CM

## 2022-04-18 NOTE — Patient Instructions (Signed)
  X chest x-ray and spirometry next visit in 9 months

## 2022-04-18 NOTE — Progress Notes (Signed)
   Subjective:    Patient ID: Gail Nguyen, female    DOB: [birth year], 76 y.o.   MRN: [id number]  HPI  76 yo  never smoker for FU of sarcoidosis . Diagnosis was made more than 30 years ago on an eye exam . She developed skin nodules and saw a dermatologist and this was biopsied 5/ 2019 which confirmed diagnosis of sarcoidosis.    Prednisone taper 04/2018 and again 05/2019 for sarcoid flare Last office visit with APP 01/2022, office he had dropped to 75% Diffusion intact.  She was given prednisone for 10 days.  Chest x-ray showed low lung volumes and increased interstitial prominence  her breathing is back to baseline Blood pressure is okay today. She denies cough or wheezing or shortness of breath   Significant tests/ events reviewed  09/2017 skin bx fore arm >> non caseating granuloma     HRCT 06/2019  stable pattern of fibrosis since 2019 small pericardial effusion, central traction bronchiectasis with mild associated interstitial coarsening.      CT chest 2003 calcified mediastinal lymph nodes   Interstitial lung disease seems to date back to 2006. HRCT 12/2017-calcified mediastinal lymph nodes, bilateral upper lobe changes consistent with sarcoid.  PFTs 11/2021 increased restrictive changes  with FEV1, ratio 83, FVC 75%, DLCO 69%.   PFTs 10/2020 FVC 96%, DLCO 66% PFTs 02/2018-no evidence of airway obstruction, FVC 93%, TLC 78%, DLCO 67% but corrects with alveolar volume?  Changes of obesity   Review of Systems neg for any significant sore throat, dysphagia, itching, sneezing, nasal congestion or excess/ purulent secretions, fever, chills, sweats, unintended wt loss, pleuritic or exertional cp, hempoptysis, orthopnea pnd or change in chronic leg swelling. Also denies presyncope, palpitations, heartburn, abdominal pain, nausea, vomiting, diarrhea or change in bowel or urinary habits, dysuria,hematuria, rash, arthralgias, visual complaints, headache, numbness weakness or ataxia.      Objective:   Physical Exam  Gen. Pleasant, obese, in no distress ENT - no lesions, no post nasal drip Neck: No JVD, no thyromegaly, no carotid bruits Lungs: no use of accessory muscles, no dullness to percussion, decreased without rales or rhonchi  Cardiovascular: Rhythm regular, heart sounds  normal, no murmurs or gallops, no peripheral edema Musculoskeletal: No deformities, no cyanosis or clubbing , no tremors       Assessment & Plan:

## 2022-04-18 NOTE — Assessment & Plan Note (Addendum)
Appears stable clinically, she does have some subcutaneous nodules but no worsening shortness of breath or cough. Notes concerned that her sarcoidosis may be still active due to intermittent symptoms. We will reassess with chest x-ray and spirometry in 9 months Vaccinations were reviewed -recommend RSV and COVID booster

## 2022-04-20 ENCOUNTER — Telehealth (HOSPITAL_BASED_OUTPATIENT_CLINIC_OR_DEPARTMENT_OTHER): Payer: Self-pay

## 2022-04-20 NOTE — Telephone Encounter (Signed)
-----   Message from Rigoberto Noel, MD sent at 04/20/2022  8:06 AM EST ----- Unchanged from prior

## 2022-04-20 NOTE — Telephone Encounter (Signed)
Called Pt and gave results of chest Xray. Pt stated understanding and nothing further needed

## 2022-06-15 IMAGING — MG MM DIGITAL SCREENING BILAT W/ TOMO AND CAD
8 series · 8 of 24 positions shown · non-contrast
Comparison: Previous exam(s).

CLINICAL DATA: Screening.

EXAM:
DIGITAL SCREENING BILATERAL MAMMOGRAM WITH TOMOSYNTHESIS AND CAD
TECHNIQUE: Bilateral screening digital craniocaudal and mediolateral oblique
mammograms were obtained. Bilateral screening digital breast
tomosynthesis was performed. The images were evaluated with
computer-aided detection.

[L MLO synth-2D]
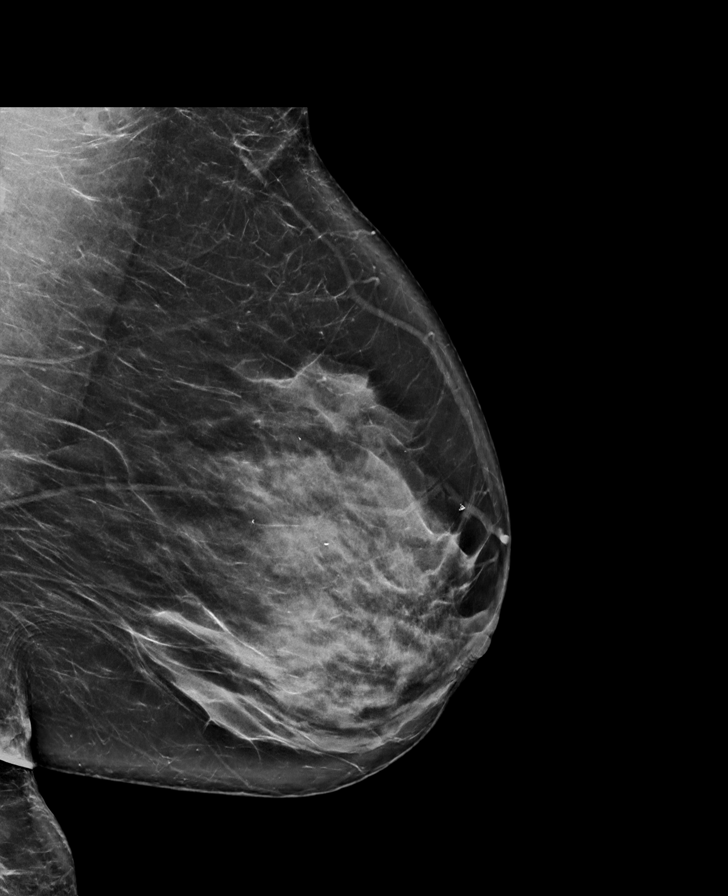

[R MLO synth-2D]
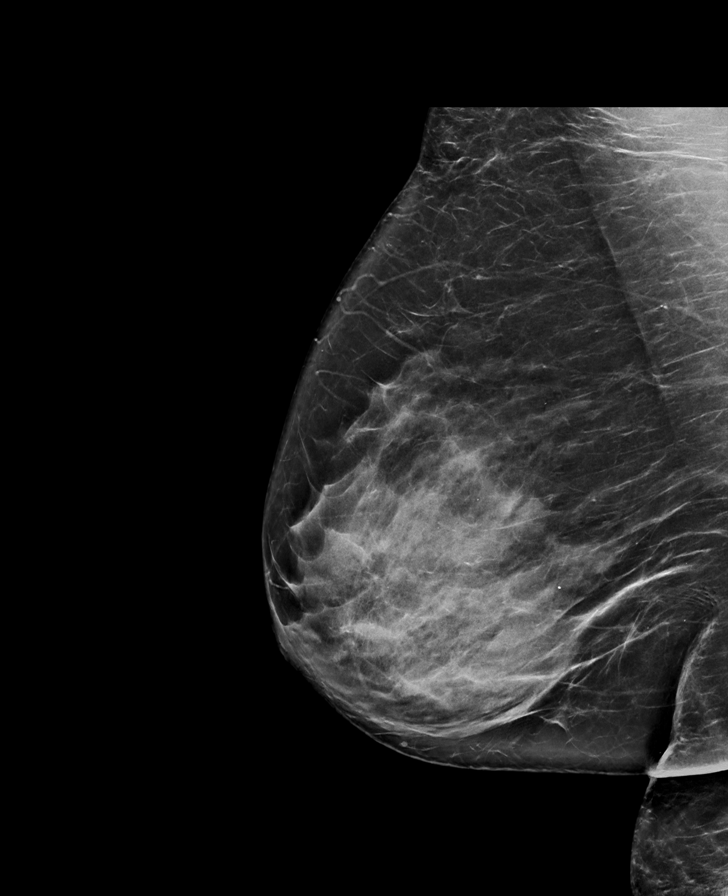

[R CC synth-2D]
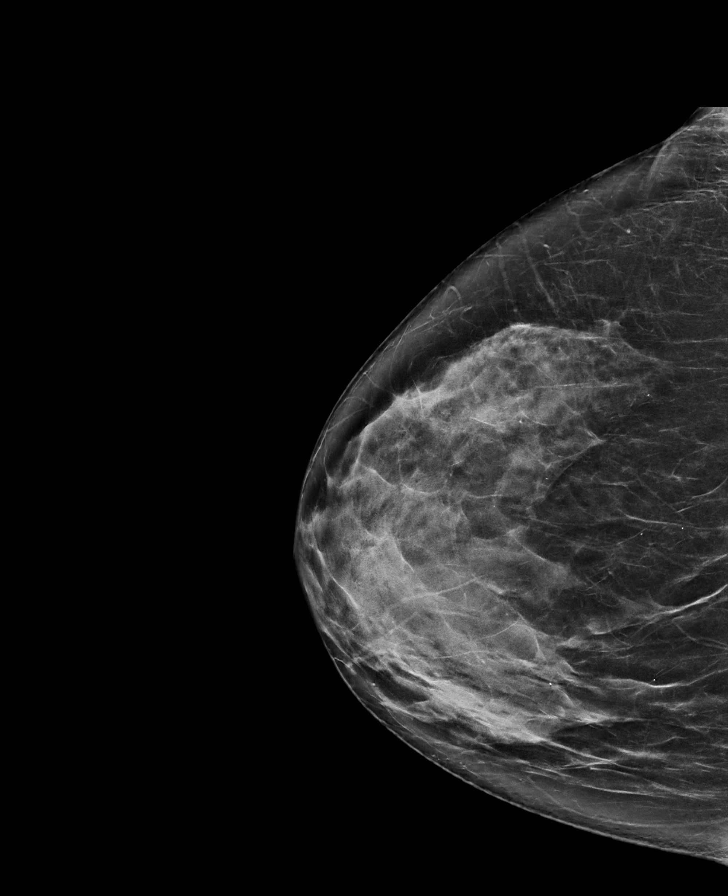

[L CC synth-2D]
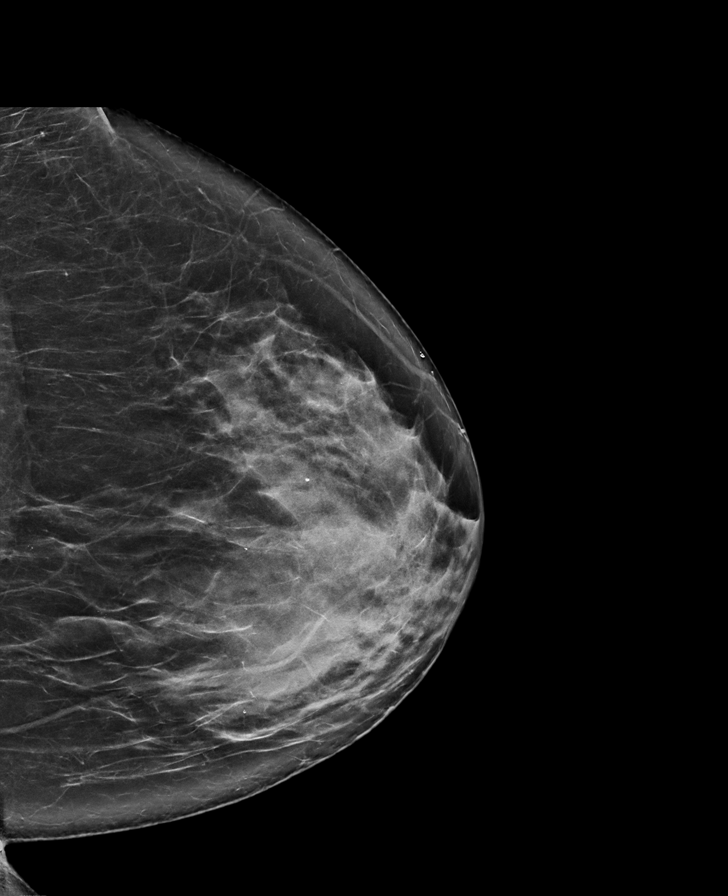

[L MLO tomo · tomo slice 42/83.0]
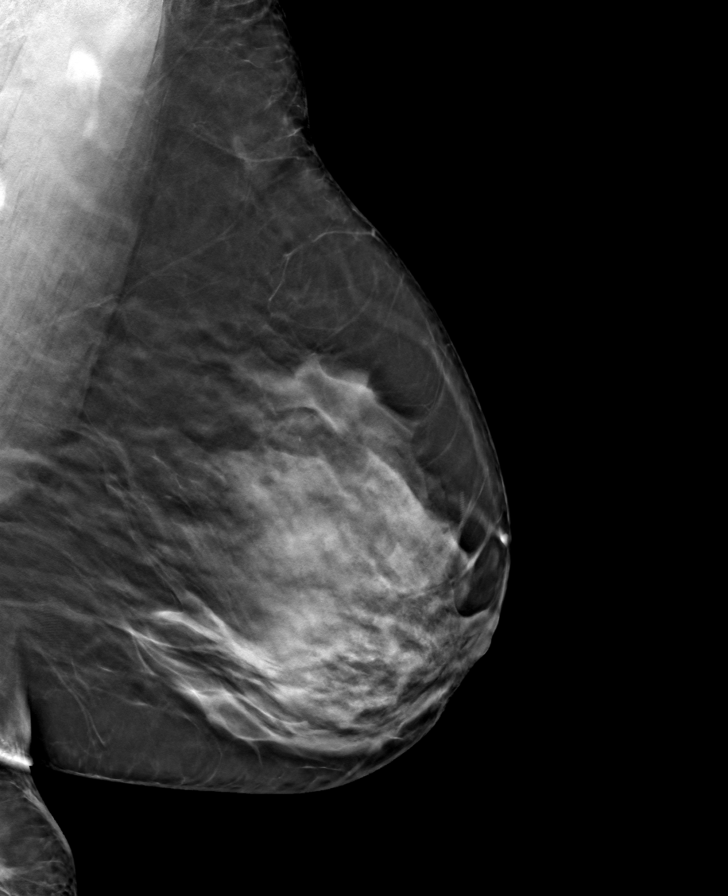

[R MLO tomo · tomo slice 43/86.0]
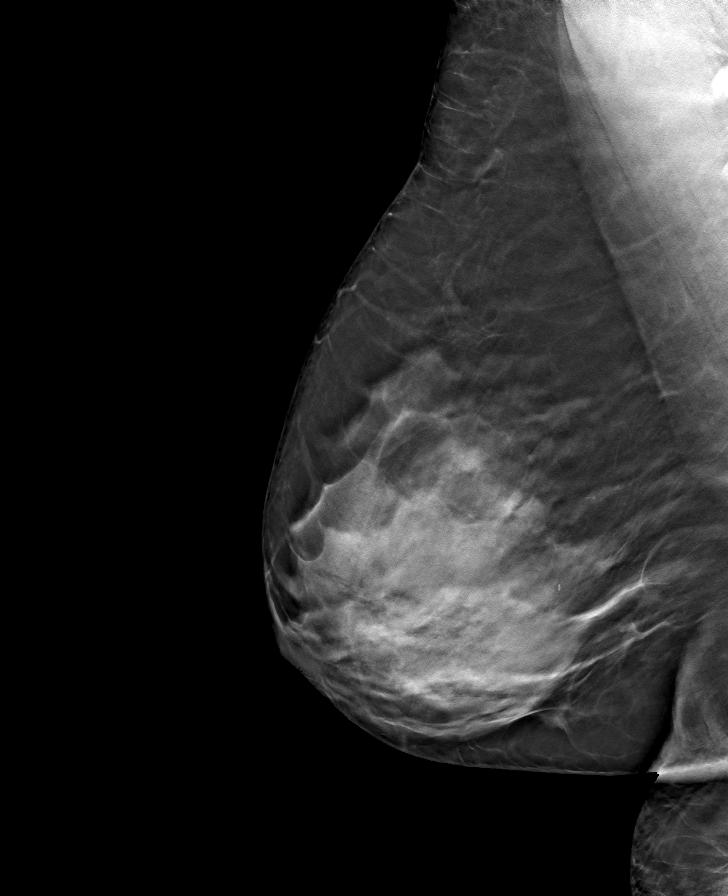

[R CC tomo · tomo slice 39/76.0]
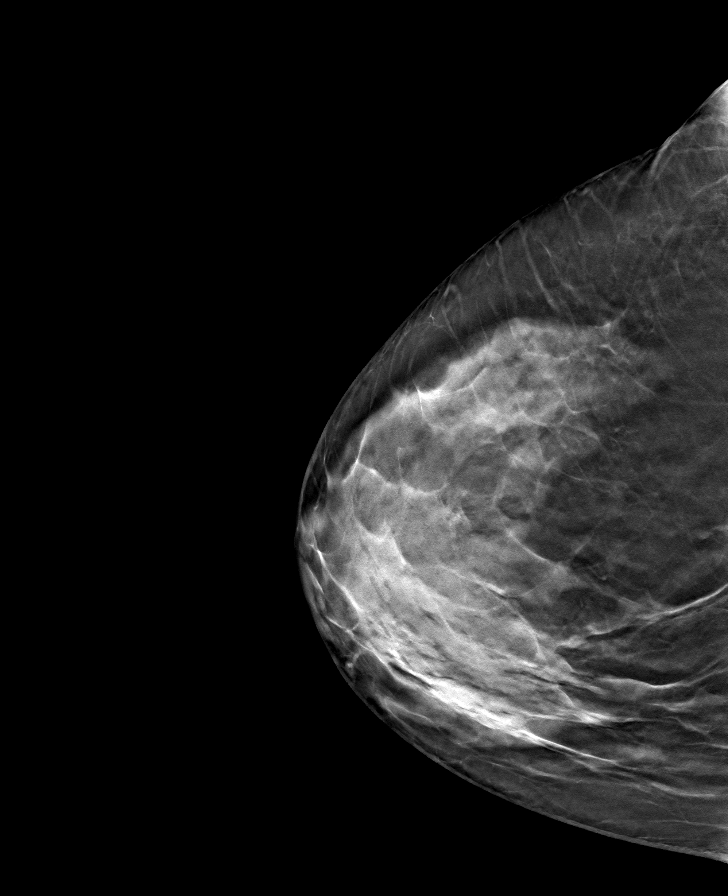

[L CC tomo · tomo slice 40/79.0]
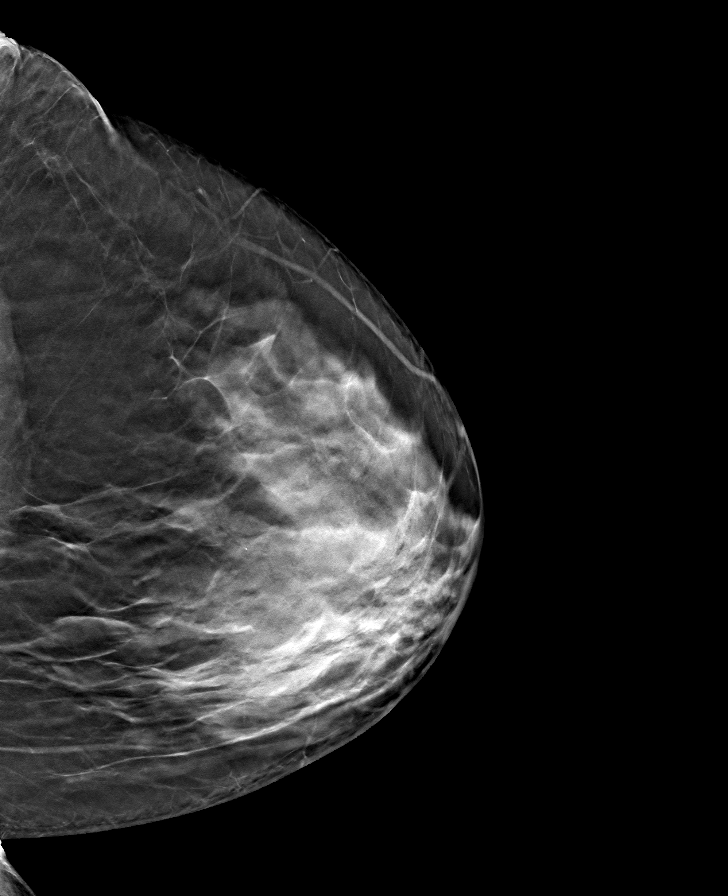

[8 of 24 positions shown; findings below may reference images not displayed]

ACR Breast Density Category c: The breast tissue is heterogeneously
dense, which may obscure small masses.
FINDINGS: There are no findings suspicious for malignancy.
IMPRESSION: No mammographic evidence of malignancy. A result letter of this
screening mammogram will be mailed directly to the patient.

RECOMMENDATION:
Screening mammogram in one year. (Code:Q3-W-BC3)

BI-RADS CATEGORY  1: Negative.

## 2022-10-30 ENCOUNTER — Other Ambulatory Visit: Payer: Self-pay | Admitting: Internal Medicine

## 2022-10-30 DIAGNOSIS — Z1231 Encounter for screening mammogram for malignant neoplasm of breast: Secondary | ICD-10-CM

## 2022-11-02 ENCOUNTER — Ambulatory Visit
Admission: RE | Admit: 2022-11-02 | Discharge: 2022-11-02 | Disposition: A | Discharge status: Home / Self Care | Payer: Medicare Other | Source: Ambulatory Visit | Attending: Internal Medicine | Admitting: Internal Medicine

## 2022-11-02 DIAGNOSIS — Z1231 Encounter for screening mammogram for malignant neoplasm of breast: Secondary | ICD-10-CM

## 2023-01-24 ENCOUNTER — Ambulatory Visit (HOSPITAL_BASED_OUTPATIENT_CLINIC_OR_DEPARTMENT_OTHER): Payer: Medicare Other

## 2023-01-24 ENCOUNTER — Encounter (HOSPITAL_BASED_OUTPATIENT_CLINIC_OR_DEPARTMENT_OTHER): Payer: Self-pay | Admitting: Pulmonary Disease

## 2023-01-24 ENCOUNTER — Ambulatory Visit (HOSPITAL_BASED_OUTPATIENT_CLINIC_OR_DEPARTMENT_OTHER): Payer: Medicare Other | Admitting: Pulmonary Disease

## 2023-01-24 VITALS — BP 142/70 | HR 57 | Ht 63.0 in | Wt 199.0 lb

## 2023-01-24 DIAGNOSIS — D869 Sarcoidosis, unspecified: Secondary | ICD-10-CM | POA: Diagnosis not present

## 2023-01-24 DIAGNOSIS — Z23 Encounter for immunization: Secondary | ICD-10-CM | POA: Diagnosis not present

## 2023-01-24 LAB — PULMONARY FUNCTION TEST
DL/VA % pred: 115 %
DL/VA: 4.75 ml/min/mmHg/L
DLCO cor % pred: 78 %
DLCO cor: 14.52 ml/min/mmHg
DLCO unc % pred: 78 %
DLCO unc: 14.52 ml/min/mmHg
FEF 25-75 Post: 2.8 L/sec
FEF 25-75 Pre: 1.74 L/sec
FEF2575-%Change-Post: 60 %
FEF2575-%Pred-Post: 180 %
FEF2575-%Pred-Pre: 112 %
FEV1-%Change-Post: 11 %
FEV1-%Pred-Post: 75 %
FEV1-%Pred-Pre: 67 %
FEV1-Post: 1.5 L
FEV1-Pre: 1.35 L
FEV1FVC-%Change-Post: 1 %
FEV1FVC-%Pred-Pre: 112 %
FEV6-%Change-Post: 10 %
FEV6-%Pred-Post: 69 %
FEV6-%Pred-Pre: 63 %
FEV6-Post: 1.76 L
FEV6-Pre: 1.6 L
FEV6FVC-%Pred-Post: 105 %
FEV6FVC-%Pred-Pre: 105 %
FVC-%Change-Post: 10 %
FVC-%Pred-Post: 66 %
FVC-%Pred-Pre: 60 %
FVC-Post: 1.76 L
FVC-Pre: 1.6 L
Post FEV1/FVC ratio: 85 %
Post FEV6/FVC ratio: 100 %
Pre FEV1/FVC ratio: 84 %
Pre FEV6/FVC Ratio: 100 %
RV % pred: 77 %
RV: 1.75 L
TLC % pred: 72 %
TLC: 3.55 L

## 2023-01-24 NOTE — Progress Notes (Signed)
   Subjective:    Patient ID: Gail Nguyen, female    DOB: 1945/11/11, 77 y.o.   MRN: [id number]  HPI  77  yo  never smoker for FU of sarcoidosis . Diagnosis was made more than 30 years ago on an eye exam . She developed skin nodules and saw a dermatologist and this was biopsied 5/ 2019 which confirmed diagnosis of sarcoidosis.    Prednisone taper 04/2018 and again 05/2019 for sarcoid flare  48-month follow-up visit. She denies dyspnea.  Cough comes and goes. No new skin lesions or nodules.  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    HRCT 12/2017-calcified mediastinal lymph nodes, bilateral upper lobe changes consistent with sarcoid.   CT chest 2003 calcified mediastinal lymph nodes   Interstitial lung disease seems to date back to 2006.   PFTs 01/2023 moderate restriction FVC 66%, TLC 72%, DLCO improved at 78%  PFTs 11/2021 increased restrictive changes  with FEV1, ratio 83, FVC 70%, DLCO 69%.   PFTs 10/2020 FVC 96%, DLCO 66% PFTs 02/2018-no evidence of airway obstruction, FVC 93%, TLC 78%, DLCO 67% but corrects with alveolar volume?  Changes of obesity  Review of Systems Pt denies any significant  nasal congestion or excess secretions, fever, chills, sweats, unintended wt loss, pleuritic or exertional cp, orthopnea pnd or leg swelling.  Pt also denies any obvious fluctuation in symptoms with weather or environmental change or other alleviating or aggravating factors.    Pt denies any increase in rescue therapy over baseline, denies waking up needing it or having early am exacerbations or coughing/wheezing/ or dyspnea      Objective:   Physical Exam  Gen. Pleasant, obese, in no distress ENT - no lesions, no post nasal drip Neck: No JVD, no  thyromegaly, no carotid bruits Lungs: no use of accessory muscles, no dullness to percussion, decreased without rales or rhonchi  Cardiovascular: Rhythm regular, heart sounds  normal, no murmurs or gallops, no peripheral edema Musculoskeletal: No deformities, no cyanosis or clubbing , no tremors       Assessment & Plan:

## 2023-01-24 NOTE — Patient Instructions (Addendum)
x flu shot today  x chest x-ray today  RSV shot  recommended

## 2023-01-24 NOTE — Progress Notes (Signed)
Full PFT Performed Today  

## 2023-01-24 NOTE — Patient Instructions (Signed)
Full PFT Performed Today  

## 2023-01-24 NOTE — Assessment & Plan Note (Signed)
Appears stable clinically, no new skin lesions. PFTs suggest restriction but DLCO is relatively improved. Will obtain chest x-ray for completion. Follow-up in a year, sooner as needed

## 2023-06-05 ENCOUNTER — Other Ambulatory Visit (HOSPITAL_COMMUNITY): Payer: Self-pay | Admitting: Internal Medicine

## 2023-06-05 DIAGNOSIS — R011 Cardiac murmur, unspecified: Secondary | ICD-10-CM

## 2023-06-05 DIAGNOSIS — I359 Nonrheumatic aortic valve disorder, unspecified: Secondary | ICD-10-CM

## 2023-06-07 ENCOUNTER — Ambulatory Visit (HOSPITAL_COMMUNITY)
Admission: RE | Admit: 2023-06-07 | Discharge: 2023-06-07 | Disposition: A | Discharge status: Home / Self Care | Payer: Medicare Other | Source: Ambulatory Visit | Attending: Internal Medicine | Admitting: Internal Medicine

## 2023-06-07 DIAGNOSIS — I359 Nonrheumatic aortic valve disorder, unspecified: Secondary | ICD-10-CM | POA: Diagnosis present

## 2023-06-07 DIAGNOSIS — I3139 Other pericardial effusion (noninflammatory): Secondary | ICD-10-CM | POA: Insufficient documentation

## 2023-06-07 DIAGNOSIS — I358 Other nonrheumatic aortic valve disorders: Secondary | ICD-10-CM | POA: Insufficient documentation

## 2023-06-07 DIAGNOSIS — I10 Essential (primary) hypertension: Secondary | ICD-10-CM | POA: Diagnosis not present

## 2023-06-07 DIAGNOSIS — R011 Cardiac murmur, unspecified: Secondary | ICD-10-CM

## 2023-06-07 LAB — ECHOCARDIOGRAM COMPLETE
AR max vel: 2.01 cm2
AV Area VTI: 2.13 cm2
AV Area mean vel: 2.12 cm2
AV Mean grad: 8.5 mm[Hg]
AV Peak grad: 13.8 mm[Hg]
Ao pk vel: 1.86 m/s
Area-P 1/2: 1.98 cm2
S' Lateral: 2.3 cm

## 2023-09-20 ENCOUNTER — Other Ambulatory Visit: Payer: Self-pay | Admitting: Internal Medicine

## 2023-09-20 DIAGNOSIS — Z1231 Encounter for screening mammogram for malignant neoplasm of breast: Secondary | ICD-10-CM

## 2023-11-05 ENCOUNTER — Ambulatory Visit
Admission: RE | Admit: 2023-11-05 | Discharge: 2023-11-05 | Disposition: A | Discharge status: Home / Self Care | Source: Ambulatory Visit | Attending: Internal Medicine

## 2023-11-05 DIAGNOSIS — Z1231 Encounter for screening mammogram for malignant neoplasm of breast: Secondary | ICD-10-CM

## 2024-02-28 ENCOUNTER — Ambulatory Visit (HOSPITAL_BASED_OUTPATIENT_CLINIC_OR_DEPARTMENT_OTHER)

## 2024-02-28 ENCOUNTER — Encounter (HOSPITAL_BASED_OUTPATIENT_CLINIC_OR_DEPARTMENT_OTHER): Payer: Self-pay | Admitting: Pulmonary Disease

## 2024-02-28 ENCOUNTER — Ambulatory Visit (HOSPITAL_BASED_OUTPATIENT_CLINIC_OR_DEPARTMENT_OTHER): Admitting: Pulmonary Disease

## 2024-02-28 VITALS — BP 138/75 | HR 64 | Ht 63.0 in | Wt 199.3 lb

## 2024-02-28 DIAGNOSIS — D869 Sarcoidosis, unspecified: Secondary | ICD-10-CM

## 2024-02-28 MED ORDER — PREDNISONE 5 MG PO TABS: 5.0000 mg | ORAL_TABLET | Freq: Every day | ORAL | 0 refills | Status: AC

## 2024-02-28 NOTE — Patient Instructions (Signed)
  VISIT SUMMARY: Today, you came in for a general follow-up appointment. Your breathing is steady, and you are not experiencing any respiratory issues or allergies.  YOUR PLAN: -SUBCUTANEOUS NODULES: Subcutaneous nodules are small lumps under the skin. You have developed new nodules on your arm similar to previous ones. We will start you on a low-dose prednisone  (5 mg daily) for 7 to 10 days to help reduce the nodules. If the nodules resolve in 7 days, you can stop the medication; if they persist, continue for 10 days. We will also do a chest x-ray to check for any changes in your lungs. Please report any new skin bumps, purple spots, worsening of breathing, or persistent cough.  INSTRUCTIONS: Please follow up with us  if you notice any new skin bumps, purple spots, worsening of breathing, or persistent cough. Additionally, we will review the results of your chest x-ray once it is completed.                      Contains text generated by [employer].                                 Contains text generated by [employer].

## 2024-02-28 NOTE — Progress Notes (Signed)
   Subjective:    Patient ID: Gail Nguyen, female    DOB: 1945/10/09, 78 y.o.   MRN: [id number]   78 yo  never smoker for FU of sarcoidosis . Diagnosis was made more than 30 years ago on an eye exam . She developed skin nodules and saw a dermatologist and this was biopsied 5/ 2019 which confirmed diagnosis of sarcoidosis.    Prednisone  taper 04/2018 and again 05/2019 for sarcoid flare    Discussed the use of AI scribe software for clinical note transcription with the patient, who gave verbal consent to proceed.  History of Present Illness Gail Nguyen is a 78 year old female who presents for a general follow-up.  Her breathing is steady with no coughing or respiratory issues. No allergies are currently acting up.     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    HRCT 12/2017-calcified mediastinal lymph nodes, bilateral upper lobe changes consistent with sarcoid.   CT chest 2003 calcified mediastinal lymph nodes   Interstitial lung disease seems to date back to 2006.     PFTs 01/2023 moderate restriction FVC 66%, TLC 72%, DLCO improved at 78%  PFTs 11/2021 increased restrictive changes  with FEV1, ratio 83, FVC 70%, DLCO 69%.   PFTs 10/2020 FVC 96%, DLCO 66% PFTs 02/2018-no evidence of airway obstruction, FVC 93%, TLC 78%, DLCO 67% but corrects with alveolar volume?  Changes of obesity  Review of Systems  neg for any significant sore throat, dysphagia, itching, sneezing, nasal congestion or excess/ purulent secretions, fever, chills, sweats, unintended wt loss, pleuritic or exertional cp, hempoptysis, orthopnea pnd or change in chronic leg swelling. Also denies presyncope, palpitations, heartburn, abdominal pain, nausea, vomiting, diarrhea or change in bowel or urinary  habits, dysuria,hematuria, rash, arthralgias, visual complaints, headache, numbness weakness or ataxia.      Objective:   Physical Exam  Gen. Pleasant, obese, in no distress ENT - no lesions, no post nasal drip Neck: No JVD, no thyromegaly, no carotid bruits Lungs: no use of accessory muscles, no dullness to percussion, decreased without rales or rhonchi  Cardiovascular: Rhythm regular, heart sounds  normal, no murmurs or gallops, no peripheral edema Musculoskeletal: No deformities, no cyanosis or clubbing , no tremors       Assessment & Plan:   Assessment and Plan Assessment & Plan  Sarcoidosis - stable from resp standpoint Subcutaneous nodules Subcutaneous nodules on the arm appeared a couple of weeks ago, similar to previous nodules that were biopsied. Considering low-dose prednisone  to address the nodules, weighing benefits against potential side effect of weight gain, previously experienced. - Order chest x-ray to assess for any changes in the lungs. - Prescribe prednisone  5 mg daily for 7 to 10 days. Discontinue if nodules resolve in 7 days; continue for 10 days if persistent. - Advise to report any new skin bumps or purple spots. - Advise to report any worsening of breathing or persistent cough.

## 2024-03-06 ENCOUNTER — Ambulatory Visit: Payer: Self-pay | Admitting: Pulmonary Disease
